# Patient Record
Sex: Female | Born: 1945 | Race: White | Hispanic: No | Marital: Married | Smoking: Never smoker
Health system: Southern US, Community
[De-identification: ages and names within clinical notes are randomized; demographics above are authoritative.]

## PROBLEM LIST (undated history)

## (undated) DIAGNOSIS — I639 Cerebral infarction, unspecified: Secondary | ICD-10-CM

## (undated) DIAGNOSIS — J45909 Unspecified asthma, uncomplicated: Secondary | ICD-10-CM

## (undated) DIAGNOSIS — I4891 Unspecified atrial fibrillation: Secondary | ICD-10-CM

## (undated) DIAGNOSIS — I10 Essential (primary) hypertension: Secondary | ICD-10-CM

## (undated) DIAGNOSIS — E7800 Pure hypercholesterolemia, unspecified: Secondary | ICD-10-CM

## (undated) HISTORY — DX: Pure hypercholesterolemia, unspecified: E78.00

## (undated) HISTORY — DX: Essential (primary) hypertension: I10

## (undated) HISTORY — DX: Unspecified atrial fibrillation: I48.91

## (undated) HISTORY — DX: Cerebral infarction, unspecified: I63.9

---

## 1987-03-18 HISTORY — PX: HYSTERECTOMY ABDOMINAL WITH SALPINGECTOMY: SHX6725

## 1998-09-14 ENCOUNTER — Ambulatory Visit (HOSPITAL_COMMUNITY): Admission: RE | Admit: 1998-09-14 | Discharge: 1998-09-14 | Payer: Self-pay | Admitting: Gastroenterology

## 1998-09-14 ENCOUNTER — Encounter: Payer: Self-pay | Admitting: Gastroenterology

## 2000-09-09 ENCOUNTER — Ambulatory Visit (HOSPITAL_COMMUNITY): Admission: RE | Admit: 2000-09-09 | Discharge: 2000-09-09 | Payer: Self-pay | Admitting: Family Medicine

## 2000-09-09 ENCOUNTER — Encounter: Payer: Self-pay | Admitting: Family Medicine

## 2001-03-17 HISTORY — PX: BREAST REDUCTION SURGERY: SHX8

## 2001-06-23 ENCOUNTER — Other Ambulatory Visit: Admission: RE | Admit: 2001-06-23 | Discharge: 2001-06-23 | Payer: Self-pay | Admitting: Family Medicine

## 2001-09-13 ENCOUNTER — Encounter (INDEPENDENT_AMBULATORY_CARE_PROVIDER_SITE_OTHER): Payer: Self-pay | Admitting: *Deleted

## 2001-09-13 ENCOUNTER — Ambulatory Visit (HOSPITAL_BASED_OUTPATIENT_CLINIC_OR_DEPARTMENT_OTHER): Admission: RE | Admit: 2001-09-13 | Discharge: 2001-09-14 | Payer: Self-pay | Admitting: Specialist

## 2008-11-21 ENCOUNTER — Encounter: Admission: RE | Admit: 2008-11-21 | Discharge: 2008-11-21 | Payer: Self-pay | Admitting: Family Medicine

## 2010-05-07 ENCOUNTER — Other Ambulatory Visit: Payer: Self-pay | Admitting: Family Medicine

## 2010-05-07 DIAGNOSIS — Z1231 Encounter for screening mammogram for malignant neoplasm of breast: Secondary | ICD-10-CM

## 2010-05-13 ENCOUNTER — Ambulatory Visit
Admission: RE | Admit: 2010-05-13 | Discharge: 2010-05-13 | Disposition: A | Discharge status: Home / Self Care | Payer: BC Managed Care – PPO | Source: Ambulatory Visit | Attending: Family Medicine | Admitting: Family Medicine

## 2010-05-13 DIAGNOSIS — Z1231 Encounter for screening mammogram for malignant neoplasm of breast: Secondary | ICD-10-CM

## 2010-08-02 NOTE — Op Note (Signed)
[facility]  Patient:    REIGNA, RUPERTO Visit Number: [id number] MRN: [id number]          Service Type: DSU Location: [facility] Attending Physician:  Gustavus Messing Dictated by:   Yaakov Guthrie. Shon Hough, M.D. Proc. Date: 09/13/01 Admit Date:  09/13/2001                             Operative Report  INDICATIONS:  This is a 65 year old lady with a history of intertrigo as well as increased back and shoulder pain, secondary to large pendulous breasts. She has tried conservative treatment which has failed.  The patient also demonstrates accessory breast tissue right and left sides, right side greater than left side.  PROCEDURE:  Bilateral breast reductions using the inferior pedicle technique, with excision of accessory breast tissue.  SURGEON:  Yaakov Guthrie. Shon Hough, M.D.  ANESTHESIA:  General.  DESCRIPTION OF PROCEDURE:  The patient underwent general anesthesia and was intubated orally.  The prep was done to the chest and breast areas in a routine fashion, after the patient had been drawn for the inferior pedicle reduction mammoplasty, remarking the nipple/areolar complexes back up to 20.0 cm from the suprasternal notch.  After sterile prep and drapes in place, 0.25% Xylocaine with epinephrine was injected locally 100 cc per side, with 1:400,000 concentration.  The wounds were scored with the #15 blade, and then the skin of the inferior pedicle was de-epithelialized with a #20 blade.  The medial and lateral fatty dermal pedicles were incised down to the underlying fascia.  Out laterally more tissue was taken.  Large amounts of accessory breast tissue and axillary latissimus dorsi areas were removed using liposuction catheter, a [place] type 28-3&4.  After this a new key hole area was also debulked, and after proper hemostasis and irrigation the wound flaps were rotated and plicated with #3-0 Prolene.  A subcutaneous closure was done with #3-0  monocryl x 2 layers, then running subcuticular stitches of #3-0 monocryl and #5-0 monocryl throughout the inverted T, with excellent symmetry and good blood supply to the nipple/areolar complexes.  The wounds were drained with #10 Harrison Mons drain which was placed in the depths of the wound and brought out through the lateral-most portion of the incision and secured with #3-0 Prolene.  Steri-Strips and soft dressings were applied to all the areas.  The same procedure was carried out on both sides.  The right breast was larger, over 900 g removed.  The left breast had over 700 g removed.  On the right side just a note:  We removed also a large fibrocystic mass from the area with fluid.  This was sent to the pathologist for separate evaluation.  The patient was taken to the recovery room in excellent condition.  The estimated blood loss was less than 150 cc.  No complications. Dictated by:   Yaakov Guthrie. Shon Hough, M.D. Attending Physician:  Gustavus Messing DD:  09/13/01 TD:  09/14/01 Job: [id number]

## 2011-03-28 DIAGNOSIS — Z23 Encounter for immunization: Secondary | ICD-10-CM | POA: Diagnosis not present

## 2011-03-28 DIAGNOSIS — Z1331 Encounter for screening for depression: Secondary | ICD-10-CM | POA: Diagnosis not present

## 2011-03-28 DIAGNOSIS — J45909 Unspecified asthma, uncomplicated: Secondary | ICD-10-CM | POA: Diagnosis not present

## 2011-03-28 DIAGNOSIS — Z1211 Encounter for screening for malignant neoplasm of colon: Secondary | ICD-10-CM | POA: Diagnosis not present

## 2011-04-28 DIAGNOSIS — Z1211 Encounter for screening for malignant neoplasm of colon: Secondary | ICD-10-CM | POA: Diagnosis not present

## 2012-01-14 DIAGNOSIS — Z23 Encounter for immunization: Secondary | ICD-10-CM | POA: Diagnosis not present

## 2012-03-26 DIAGNOSIS — J45909 Unspecified asthma, uncomplicated: Secondary | ICD-10-CM | POA: Diagnosis not present

## 2012-03-26 DIAGNOSIS — S43429A Sprain of unspecified rotator cuff capsule, initial encounter: Secondary | ICD-10-CM | POA: Diagnosis not present

## 2012-03-26 DIAGNOSIS — M810 Age-related osteoporosis without current pathological fracture: Secondary | ICD-10-CM | POA: Diagnosis not present

## 2012-05-10 DIAGNOSIS — M810 Age-related osteoporosis without current pathological fracture: Secondary | ICD-10-CM | POA: Diagnosis not present

## 2012-11-08 DIAGNOSIS — H31099 Other chorioretinal scars, unspecified eye: Secondary | ICD-10-CM | POA: Diagnosis not present

## 2012-11-08 DIAGNOSIS — H5340 Unspecified visual field defects: Secondary | ICD-10-CM | POA: Diagnosis not present

## 2012-11-08 DIAGNOSIS — H5710 Ocular pain, unspecified eye: Secondary | ICD-10-CM | POA: Diagnosis not present

## 2012-12-28 DIAGNOSIS — Z23 Encounter for immunization: Secondary | ICD-10-CM | POA: Diagnosis not present

## 2013-02-21 ENCOUNTER — Other Ambulatory Visit: Payer: Self-pay | Admitting: Family Medicine

## 2013-02-21 DIAGNOSIS — J45901 Unspecified asthma with (acute) exacerbation: Secondary | ICD-10-CM | POA: Diagnosis not present

## 2013-02-21 DIAGNOSIS — J019 Acute sinusitis, unspecified: Secondary | ICD-10-CM | POA: Diagnosis not present

## 2013-02-21 DIAGNOSIS — Z1239 Encounter for other screening for malignant neoplasm of breast: Secondary | ICD-10-CM | POA: Diagnosis not present

## 2013-02-21 DIAGNOSIS — Z1231 Encounter for screening mammogram for malignant neoplasm of breast: Secondary | ICD-10-CM

## 2013-02-25 ENCOUNTER — Emergency Department (HOSPITAL_BASED_OUTPATIENT_CLINIC_OR_DEPARTMENT_OTHER): Payer: Medicare Other

## 2013-02-25 ENCOUNTER — Encounter (HOSPITAL_BASED_OUTPATIENT_CLINIC_OR_DEPARTMENT_OTHER): Payer: Self-pay | Admitting: Emergency Medicine

## 2013-02-25 ENCOUNTER — Emergency Department (HOSPITAL_BASED_OUTPATIENT_CLINIC_OR_DEPARTMENT_OTHER)
Admission: EM | Admit: 2013-02-25 | Discharge: 2013-02-25 | Disposition: A | Discharge status: Home / Self Care | Payer: Medicare Other | Attending: Emergency Medicine | Admitting: Emergency Medicine

## 2013-02-25 DIAGNOSIS — R0789 Other chest pain: Secondary | ICD-10-CM

## 2013-02-25 DIAGNOSIS — Z79899 Other long term (current) drug therapy: Secondary | ICD-10-CM | POA: Insufficient documentation

## 2013-02-25 DIAGNOSIS — R071 Chest pain on breathing: Secondary | ICD-10-CM | POA: Diagnosis not present

## 2013-02-25 DIAGNOSIS — J45901 Unspecified asthma with (acute) exacerbation: Secondary | ICD-10-CM | POA: Diagnosis not present

## 2013-02-25 DIAGNOSIS — Z792 Long term (current) use of antibiotics: Secondary | ICD-10-CM | POA: Insufficient documentation

## 2013-02-25 DIAGNOSIS — R6889 Other general symptoms and signs: Secondary | ICD-10-CM | POA: Diagnosis not present

## 2013-02-25 DIAGNOSIS — J019 Acute sinusitis, unspecified: Secondary | ICD-10-CM | POA: Diagnosis not present

## 2013-02-25 DIAGNOSIS — J209 Acute bronchitis, unspecified: Secondary | ICD-10-CM | POA: Diagnosis not present

## 2013-02-25 DIAGNOSIS — IMO0002 Reserved for concepts with insufficient information to code with codable children: Secondary | ICD-10-CM | POA: Insufficient documentation

## 2013-02-25 DIAGNOSIS — J40 Bronchitis, not specified as acute or chronic: Secondary | ICD-10-CM | POA: Diagnosis not present

## 2013-02-25 DIAGNOSIS — N644 Mastodynia: Secondary | ICD-10-CM | POA: Diagnosis not present

## 2013-02-25 HISTORY — DX: Unspecified asthma, uncomplicated: J45.909

## 2013-02-25 LAB — BASIC METABOLIC PANEL
BUN: 14 mg/dL (ref 6–23)
Calcium: 9.8 mg/dL (ref 8.4–10.5)
Chloride: 105 mEq/L (ref 96–112)
Creatinine, Ser: 1.1 mg/dL (ref 0.50–1.10)
GFR calc Af Amer: 58 mL/min — ABNORMAL LOW (ref >90)
GFR calc non Af Amer: 50 mL/min — ABNORMAL LOW (ref >90)
Glucose, Bld: 115 mg/dL — ABNORMAL HIGH (ref 70–99)
Potassium: 4.1 mEq/L (ref 3.5–5.1)
Sodium: 140 mEq/L (ref 135–145)

## 2013-02-25 LAB — CBC WITH DIFFERENTIAL/PLATELET
Eosinophils Absolute: 0.2 10*3/uL (ref 0.0–0.7)
Hemoglobin: 13.6 g/dL (ref 12.0–15.0)
Lymphs Abs: 1.2 10*3/uL (ref 0.7–4.0)
MCH: 28 pg (ref 26.0–34.0)
Monocytes Absolute: 0.5 10*3/uL (ref 0.1–1.0)
Monocytes Relative: 7 % (ref 3–12)
Neutro Abs: 6.1 10*3/uL (ref 1.7–7.7)
Neutrophils Relative %: 76 % (ref 43–77)
Platelets: 257 10*3/uL (ref 150–400)
RBC: 4.85 MIL/uL (ref 3.87–5.11)
RDW: 13.8 % (ref 11.5–15.5)
WBC: 8 10*3/uL (ref 4.0–10.5)

## 2013-02-25 MED ORDER — HYDROCOD POLST-CHLORPHEN POLST 10-8 MG/5ML PO LQCR: 5.0000 mL | Freq: Two times a day (BID) | ORAL | Status: DC | PRN

## 2013-02-25 MED ORDER — TRAMADOL HCL 50 MG PO TABS: 50.0000 mg | ORAL_TABLET | Freq: Four times a day (QID) | ORAL | Status: DC | PRN

## 2013-02-25 MED ORDER — ASPIRIN 81 MG PO CHEW
324.0000 mg | CHEWABLE_TABLET | Freq: Once | ORAL | Status: AC
  Administered 2013-02-25: 324 mg via ORAL
  Filled 2013-02-25: qty 4

## 2013-02-25 MED ORDER — KETOROLAC TROMETHAMINE 30 MG/ML IJ SOLN
30.0000 mg | Freq: Once | INTRAMUSCULAR | Status: AC
  Administered 2013-02-25: 30 mg via INTRAVENOUS
  Filled 2013-02-25: qty 1

## 2013-02-25 NOTE — ED Provider Notes (Signed)
CSN: [id number]     Arrival date & time 02/25/13  1027 History   First MD Initiated Contact with Patient 02/25/13 1206     Chief Complaint  Patient presents with  . right breast pain radiating to back    (Consider location/radiation/quality/duration/timing/severity/associated sxs/prior Treatment) HPI Comments: Patient presents with a chief complaint of chest pain.  She reports that the pain is located just inferior to her right breast and radiates around to the right side of her back.  She reports that the pain began around 9 AM this morning after a heavy coughing episode.  She states that the pain has been constant since that time.  Pain is worse with movements, deep breaths, coughing, and palpation.  She has not taken anything for pain prior to arrival.  She denies any SOB, fever, nausea, vomiting, numbness, tingling, diaphoresis, dizziness, or lightheadedness associated with the pain.  She reports that she was recently diagnosed with Acute Sinusitis four days ago and is currently on Amoxicillin for this.  She denies any prior cardiac history.  She has a history of Hyperlipidemia, but no history of HTN or DM.  She does not smoke and never has.  She denies any prolonged travel or surgery in the past 4 weeks.  Denies any lower extremity edema.  Denies use of estrogen containing medications.  Denies prior history of DVT or PE.  The history is provided by the patient.    Past Medical History  Diagnosis Date  . Asthma    History reviewed. No pertinent past surgical history. History reviewed. No pertinent family history. History  Substance Use Topics  . Smoking status: Never Smoker   . Smokeless tobacco: Not on file  . Alcohol Use: No   OB History   Grav Para Term Preterm Abortions TAB SAB Ect Mult Living                 Review of Systems  All other systems reviewed and are negative.    Allergies  Levaquin  Home Medications   Current Outpatient Rx  Name  Route  Sig  Dispense   Refill  . amoxicillin (AMOXIL) 125 MG chewable tablet   Oral   Chew 125 mg by mouth 3 (three) times daily.         . fluticasone-salmeterol (ADVAIR HFA) 115-21 MCG/ACT inhaler   Inhalation   Inhale 2 puffs into the lungs 2 (two) times daily.         Marland Kitchen omeprazole (PRILOSEC) 40 MG capsule   Oral   Take 40 mg by mouth daily.          BP 150/99  Pulse 69  Temp(Src) 98 F (36.7 C) (Oral)  Resp 20  SpO2 99% Physical Exam  Nursing note and vitals reviewed. Constitutional: She appears well-developed and well-nourished.  HENT:  Head: Normocephalic and atraumatic.  Mouth/Throat: Oropharynx is clear and moist.  Neck: Normal range of motion. Neck supple.  Cardiovascular: Normal rate, regular rhythm and normal heart sounds.   Pulmonary/Chest: Effort normal and breath sounds normal. No respiratory distress. She has no wheezes. She has no rales. She exhibits tenderness.  Abdominal: Soft. Bowel sounds are normal. She exhibits no distension and no mass. There is no tenderness. There is no rebound and no guarding.  Musculoskeletal:  No LE edema bilaterally  Neurological: She is alert.  Skin: Skin is warm and dry.  Psychiatric: She has a normal mood and affect.    ED Course  Procedures (including critical  care time) Labs Review Labs Reviewed - No data to display Imaging Review Dg Chest 2 View  02/25/2013   CLINICAL DATA:  Right-sided chest pain.  Productive cough.  EXAM: CHEST  2 VIEW  COMPARISON:  None.  FINDINGS: There is slight peribronchial thickening consistent with bronchitis. There is a linear area of probable scarring at the left lung base. No infiltrates or effusions. Heart size is normal. No acute osseous abnormality. Old healed fracture of the lateral aspect of the right 4th rib.  IMPRESSION: Bronchitic changes.   Electronically Signed   By: Geanie Cooley M.D.   On: 02/25/2013 11:17    EKG Interpretation    Date/Time:  Friday February 25 2013 10:41:25 EST Ventricular  Rate:  57 PR Interval:  134 QRS Duration: 82 QT Interval:  418 QTC Calculation: 406 R Axis:   -9 Text Interpretation:  Sinus bradycardia Possible Anterior infarct , age undetermined Abnormal ECG Confirmed by BEATON  MD, ROBERT ([id number]) on 02/25/2013 11:49:34 AM           2:15 PM Reassessed patient.  Patient reports that her chest pain has improved at this time after getting Toradol.  Patient discussed with Dr. Radford Pax who also evaluated the patient. MDM  No diagnosis found. Patient presenting with chest wall pain.  Pain began this morning after having a heavy coughing episode.  Pain improved after given Toradol.  EKG unremarkable.  Troponin negative.  No acute findings on CXR.  Pulse ox 99 on RA.  Lungs CTAB.  No signs of respiratory distress.  Feel that the patient is stable for discharge.  Patient discharged home with cough suppressant.  Return precautions given.     Santiago Glad, PA-C 02/28/13 [phone]

## 2013-02-25 NOTE — ED Notes (Signed)
Pain around right breast that radiates down side and around to the back onset this morning. She called 911 EMS states they told her the ekg was normal and should come and have further evaluation  Pt states she has been having hard coughing for 3 weeks and this morning when she coughed the pain started.

## 2013-02-28 NOTE — ED Provider Notes (Signed)
Medical screening examination/treatment/procedure(s) were performed by non-physician practitioner and as supervising physician I was immediately available for consultation/collaboration.    Nelia Shi, MD 02/28/13 [phone]

## 2013-03-25 ENCOUNTER — Ambulatory Visit
Admission: RE | Admit: 2013-03-25 | Discharge: 2013-03-25 | Disposition: A | Discharge status: Home / Self Care | Payer: Medicare Other | Source: Ambulatory Visit | Attending: Family Medicine | Admitting: Family Medicine

## 2013-03-25 DIAGNOSIS — Z1231 Encounter for screening mammogram for malignant neoplasm of breast: Secondary | ICD-10-CM

## 2013-05-03 DIAGNOSIS — J45909 Unspecified asthma, uncomplicated: Secondary | ICD-10-CM | POA: Diagnosis not present

## 2013-09-06 DIAGNOSIS — H2510 Age-related nuclear cataract, unspecified eye: Secondary | ICD-10-CM | POA: Diagnosis not present

## 2013-09-06 DIAGNOSIS — H538 Other visual disturbances: Secondary | ICD-10-CM | POA: Diagnosis not present

## 2013-09-06 DIAGNOSIS — H31099 Other chorioretinal scars, unspecified eye: Secondary | ICD-10-CM | POA: Diagnosis not present

## 2013-11-22 DIAGNOSIS — Z23 Encounter for immunization: Secondary | ICD-10-CM | POA: Diagnosis not present

## 2014-05-02 DIAGNOSIS — J45909 Unspecified asthma, uncomplicated: Secondary | ICD-10-CM | POA: Diagnosis not present

## 2014-05-02 DIAGNOSIS — M810 Age-related osteoporosis without current pathological fracture: Secondary | ICD-10-CM | POA: Diagnosis not present

## 2014-05-02 DIAGNOSIS — E780 Pure hypercholesterolemia: Secondary | ICD-10-CM | POA: Diagnosis not present

## 2014-05-02 DIAGNOSIS — Z1389 Encounter for screening for other disorder: Secondary | ICD-10-CM | POA: Diagnosis not present

## 2014-05-02 DIAGNOSIS — R6889 Other general symptoms and signs: Secondary | ICD-10-CM | POA: Diagnosis not present

## 2014-05-02 DIAGNOSIS — Z131 Encounter for screening for diabetes mellitus: Secondary | ICD-10-CM | POA: Diagnosis not present

## 2015-04-10 DIAGNOSIS — Z23 Encounter for immunization: Secondary | ICD-10-CM | POA: Diagnosis not present

## 2015-05-02 DIAGNOSIS — J45909 Unspecified asthma, uncomplicated: Secondary | ICD-10-CM | POA: Diagnosis not present

## 2015-11-27 DIAGNOSIS — H1859 Other hereditary corneal dystrophies: Secondary | ICD-10-CM | POA: Diagnosis not present

## 2015-11-27 DIAGNOSIS — H31093 Other chorioretinal scars, bilateral: Secondary | ICD-10-CM | POA: Diagnosis not present

## 2015-12-26 DIAGNOSIS — Z23 Encounter for immunization: Secondary | ICD-10-CM | POA: Diagnosis not present

## 2016-04-30 DIAGNOSIS — M810 Age-related osteoporosis without current pathological fracture: Secondary | ICD-10-CM | POA: Diagnosis not present

## 2016-04-30 DIAGNOSIS — J45909 Unspecified asthma, uncomplicated: Secondary | ICD-10-CM | POA: Diagnosis not present

## 2016-04-30 DIAGNOSIS — E782 Mixed hyperlipidemia: Secondary | ICD-10-CM | POA: Diagnosis not present

## 2016-04-30 DIAGNOSIS — Z1211 Encounter for screening for malignant neoplasm of colon: Secondary | ICD-10-CM | POA: Diagnosis not present

## 2016-06-24 ENCOUNTER — Other Ambulatory Visit: Payer: Self-pay | Admitting: Gastroenterology

## 2016-06-24 DIAGNOSIS — R1310 Dysphagia, unspecified: Secondary | ICD-10-CM

## 2016-06-24 DIAGNOSIS — Z800 Family history of malignant neoplasm of digestive organs: Secondary | ICD-10-CM | POA: Diagnosis not present

## 2016-06-24 DIAGNOSIS — R1319 Other dysphagia: Secondary | ICD-10-CM

## 2016-06-26 DIAGNOSIS — M810 Age-related osteoporosis without current pathological fracture: Secondary | ICD-10-CM | POA: Diagnosis not present

## 2016-06-26 DIAGNOSIS — M8588 Other specified disorders of bone density and structure, other site: Secondary | ICD-10-CM | POA: Diagnosis not present

## 2016-07-04 ENCOUNTER — Ambulatory Visit
Admission: RE | Admit: 2016-07-04 | Discharge: 2016-07-04 | Disposition: A | Discharge status: Home / Self Care | Payer: Medicare Other | Source: Ambulatory Visit | Attending: Gastroenterology | Admitting: Gastroenterology

## 2016-07-04 DIAGNOSIS — R1319 Other dysphagia: Secondary | ICD-10-CM

## 2016-07-04 DIAGNOSIS — R1310 Dysphagia, unspecified: Secondary | ICD-10-CM

## 2016-07-04 DIAGNOSIS — K228 Other specified diseases of esophagus: Secondary | ICD-10-CM | POA: Diagnosis not present

## 2016-07-18 DIAGNOSIS — Q396 Congenital diverticulum of esophagus: Secondary | ICD-10-CM | POA: Diagnosis not present

## 2016-07-18 DIAGNOSIS — Z1211 Encounter for screening for malignant neoplasm of colon: Secondary | ICD-10-CM | POA: Diagnosis not present

## 2016-08-29 DIAGNOSIS — Z1211 Encounter for screening for malignant neoplasm of colon: Secondary | ICD-10-CM | POA: Diagnosis not present

## 2016-08-29 DIAGNOSIS — D126 Benign neoplasm of colon, unspecified: Secondary | ICD-10-CM | POA: Diagnosis not present

## 2016-08-29 DIAGNOSIS — K219 Gastro-esophageal reflux disease without esophagitis: Secondary | ICD-10-CM | POA: Diagnosis not present

## 2016-08-29 DIAGNOSIS — K228 Other specified diseases of esophagus: Secondary | ICD-10-CM | POA: Diagnosis not present

## 2016-08-29 DIAGNOSIS — R1310 Dysphagia, unspecified: Secondary | ICD-10-CM | POA: Diagnosis not present

## 2016-09-02 DIAGNOSIS — K219 Gastro-esophageal reflux disease without esophagitis: Secondary | ICD-10-CM | POA: Diagnosis not present

## 2016-09-02 DIAGNOSIS — D126 Benign neoplasm of colon, unspecified: Secondary | ICD-10-CM | POA: Diagnosis not present

## 2016-12-05 DIAGNOSIS — H2513 Age-related nuclear cataract, bilateral: Secondary | ICD-10-CM | POA: Diagnosis not present

## 2016-12-17 DIAGNOSIS — Z23 Encounter for immunization: Secondary | ICD-10-CM | POA: Diagnosis not present

## 2017-02-10 DIAGNOSIS — H0230 Blepharochalasis unspecified eye, unspecified eyelid: Secondary | ICD-10-CM | POA: Diagnosis not present

## 2017-02-10 DIAGNOSIS — H2513 Age-related nuclear cataract, bilateral: Secondary | ICD-10-CM | POA: Diagnosis not present

## 2017-02-10 DIAGNOSIS — H25013 Cortical age-related cataract, bilateral: Secondary | ICD-10-CM | POA: Diagnosis not present

## 2017-02-10 DIAGNOSIS — H2511 Age-related nuclear cataract, right eye: Secondary | ICD-10-CM | POA: Diagnosis not present

## 2017-02-10 DIAGNOSIS — H25043 Posterior subcapsular polar age-related cataract, bilateral: Secondary | ICD-10-CM | POA: Diagnosis not present

## 2017-02-10 DIAGNOSIS — H40003 Preglaucoma, unspecified, bilateral: Secondary | ICD-10-CM | POA: Diagnosis not present

## 2017-03-01 DIAGNOSIS — I10 Essential (primary) hypertension: Secondary | ICD-10-CM | POA: Diagnosis not present

## 2017-03-01 DIAGNOSIS — S82402A Unspecified fracture of shaft of left fibula, initial encounter for closed fracture: Secondary | ICD-10-CM | POA: Diagnosis not present

## 2017-03-01 DIAGNOSIS — S82832A Other fracture of upper and lower end of left fibula, initial encounter for closed fracture: Secondary | ICD-10-CM | POA: Diagnosis not present

## 2017-03-01 DIAGNOSIS — M25572 Pain in left ankle and joints of left foot: Secondary | ICD-10-CM | POA: Diagnosis not present

## 2017-03-02 DIAGNOSIS — S82832A Other fracture of upper and lower end of left fibula, initial encounter for closed fracture: Secondary | ICD-10-CM | POA: Diagnosis not present

## 2017-03-19 DIAGNOSIS — S82832D Other fracture of upper and lower end of left fibula, subsequent encounter for closed fracture with routine healing: Secondary | ICD-10-CM | POA: Diagnosis not present

## 2017-03-30 DIAGNOSIS — H2513 Age-related nuclear cataract, bilateral: Secondary | ICD-10-CM | POA: Diagnosis not present

## 2017-03-30 DIAGNOSIS — H2511 Age-related nuclear cataract, right eye: Secondary | ICD-10-CM | POA: Diagnosis not present

## 2017-03-31 DIAGNOSIS — H2512 Age-related nuclear cataract, left eye: Secondary | ICD-10-CM | POA: Diagnosis not present

## 2017-04-20 DIAGNOSIS — H2512 Age-related nuclear cataract, left eye: Secondary | ICD-10-CM | POA: Diagnosis not present

## 2017-04-20 DIAGNOSIS — H2513 Age-related nuclear cataract, bilateral: Secondary | ICD-10-CM | POA: Diagnosis not present

## 2017-04-23 DIAGNOSIS — S82832D Other fracture of upper and lower end of left fibula, subsequent encounter for closed fracture with routine healing: Secondary | ICD-10-CM | POA: Diagnosis not present

## 2017-04-30 DIAGNOSIS — J45909 Unspecified asthma, uncomplicated: Secondary | ICD-10-CM | POA: Diagnosis not present

## 2017-04-30 DIAGNOSIS — Z0000 Encounter for general adult medical examination without abnormal findings: Secondary | ICD-10-CM | POA: Diagnosis not present

## 2017-04-30 DIAGNOSIS — Z1231 Encounter for screening mammogram for malignant neoplasm of breast: Secondary | ICD-10-CM | POA: Diagnosis not present

## 2017-04-30 DIAGNOSIS — R030 Elevated blood-pressure reading, without diagnosis of hypertension: Secondary | ICD-10-CM | POA: Diagnosis not present

## 2017-04-30 DIAGNOSIS — E782 Mixed hyperlipidemia: Secondary | ICD-10-CM | POA: Diagnosis not present

## 2017-04-30 DIAGNOSIS — M810 Age-related osteoporosis without current pathological fracture: Secondary | ICD-10-CM | POA: Diagnosis not present

## 2017-05-07 ENCOUNTER — Other Ambulatory Visit: Payer: Self-pay | Admitting: Physician Assistant

## 2017-05-07 DIAGNOSIS — Z1231 Encounter for screening mammogram for malignant neoplasm of breast: Secondary | ICD-10-CM

## 2017-06-04 DIAGNOSIS — M19072 Primary osteoarthritis, left ankle and foot: Secondary | ICD-10-CM | POA: Diagnosis not present

## 2017-06-04 DIAGNOSIS — S82832D Other fracture of upper and lower end of left fibula, subsequent encounter for closed fracture with routine healing: Secondary | ICD-10-CM | POA: Diagnosis not present

## 2017-06-04 DIAGNOSIS — Z881 Allergy status to other antibiotic agents status: Secondary | ICD-10-CM | POA: Diagnosis not present

## 2017-08-21 DIAGNOSIS — Z23 Encounter for immunization: Secondary | ICD-10-CM | POA: Diagnosis not present

## 2017-10-16 DIAGNOSIS — H43391 Other vitreous opacities, right eye: Secondary | ICD-10-CM | POA: Diagnosis not present

## 2017-11-18 DIAGNOSIS — H43313 Vitreous membranes and strands, bilateral: Secondary | ICD-10-CM | POA: Diagnosis not present

## 2018-01-13 DIAGNOSIS — Z23 Encounter for immunization: Secondary | ICD-10-CM | POA: Diagnosis not present

## 2018-05-03 DIAGNOSIS — Z1231 Encounter for screening mammogram for malignant neoplasm of breast: Secondary | ICD-10-CM | POA: Diagnosis not present

## 2018-05-03 DIAGNOSIS — J45909 Unspecified asthma, uncomplicated: Secondary | ICD-10-CM | POA: Diagnosis not present

## 2018-05-03 DIAGNOSIS — M810 Age-related osteoporosis without current pathological fracture: Secondary | ICD-10-CM | POA: Diagnosis not present

## 2018-05-03 DIAGNOSIS — E782 Mixed hyperlipidemia: Secondary | ICD-10-CM | POA: Diagnosis not present

## 2018-05-03 DIAGNOSIS — Z0000 Encounter for general adult medical examination without abnormal findings: Secondary | ICD-10-CM | POA: Diagnosis not present

## 2018-05-03 DIAGNOSIS — Z13820 Encounter for screening for osteoporosis: Secondary | ICD-10-CM | POA: Diagnosis not present

## 2018-05-07 ENCOUNTER — Other Ambulatory Visit: Payer: Self-pay | Admitting: Physician Assistant

## 2018-05-07 DIAGNOSIS — M810 Age-related osteoporosis without current pathological fracture: Secondary | ICD-10-CM

## 2018-05-14 ENCOUNTER — Other Ambulatory Visit: Payer: Self-pay | Admitting: Anesthesiology

## 2018-05-14 DIAGNOSIS — Z1231 Encounter for screening mammogram for malignant neoplasm of breast: Secondary | ICD-10-CM

## 2019-01-04 DIAGNOSIS — Z23 Encounter for immunization: Secondary | ICD-10-CM | POA: Diagnosis not present

## 2019-06-13 DIAGNOSIS — H43391 Other vitreous opacities, right eye: Secondary | ICD-10-CM | POA: Diagnosis not present

## 2019-12-27 DIAGNOSIS — Z23 Encounter for immunization: Secondary | ICD-10-CM | POA: Diagnosis not present

## 2020-02-15 DIAGNOSIS — Z23 Encounter for immunization: Secondary | ICD-10-CM | POA: Diagnosis not present

## 2020-02-22 DIAGNOSIS — H43391 Other vitreous opacities, right eye: Secondary | ICD-10-CM | POA: Diagnosis not present

## 2020-05-09 DIAGNOSIS — R1310 Dysphagia, unspecified: Secondary | ICD-10-CM | POA: Diagnosis not present

## 2020-05-09 DIAGNOSIS — J45909 Unspecified asthma, uncomplicated: Secondary | ICD-10-CM | POA: Diagnosis not present

## 2020-05-09 DIAGNOSIS — E782 Mixed hyperlipidemia: Secondary | ICD-10-CM | POA: Diagnosis not present

## 2020-05-21 DIAGNOSIS — G319 Degenerative disease of nervous system, unspecified: Secondary | ICD-10-CM | POA: Diagnosis not present

## 2020-05-21 DIAGNOSIS — I6502 Occlusion and stenosis of left vertebral artery: Secondary | ICD-10-CM | POA: Diagnosis not present

## 2020-05-21 DIAGNOSIS — I499 Cardiac arrhythmia, unspecified: Secondary | ICD-10-CM | POA: Diagnosis not present

## 2020-05-21 DIAGNOSIS — I6782 Cerebral ischemia: Secondary | ICD-10-CM | POA: Diagnosis not present

## 2020-05-21 DIAGNOSIS — R531 Weakness: Secondary | ICD-10-CM | POA: Diagnosis not present

## 2020-05-21 DIAGNOSIS — Z79899 Other long term (current) drug therapy: Secondary | ICD-10-CM | POA: Diagnosis not present

## 2020-05-21 DIAGNOSIS — I4891 Unspecified atrial fibrillation: Secondary | ICD-10-CM | POA: Diagnosis not present

## 2020-05-21 DIAGNOSIS — I6340 Cerebral infarction due to embolism of unspecified cerebral artery: Secondary | ICD-10-CM | POA: Diagnosis not present

## 2020-05-21 DIAGNOSIS — I10 Essential (primary) hypertension: Secondary | ICD-10-CM | POA: Diagnosis not present

## 2020-05-21 DIAGNOSIS — I517 Cardiomegaly: Secondary | ICD-10-CM | POA: Diagnosis not present

## 2020-05-21 DIAGNOSIS — R0602 Shortness of breath: Secondary | ICD-10-CM | POA: Diagnosis not present

## 2020-05-21 DIAGNOSIS — J4530 Mild persistent asthma, uncomplicated: Secondary | ICD-10-CM | POA: Diagnosis not present

## 2020-05-21 DIAGNOSIS — G459 Transient cerebral ischemic attack, unspecified: Secondary | ICD-10-CM | POA: Diagnosis not present

## 2020-05-21 DIAGNOSIS — Z888 Allergy status to other drugs, medicaments and biological substances status: Secondary | ICD-10-CM | POA: Diagnosis not present

## 2020-05-21 DIAGNOSIS — R42 Dizziness and giddiness: Secondary | ICD-10-CM | POA: Diagnosis not present

## 2020-05-21 DIAGNOSIS — Z7901 Long term (current) use of anticoagulants: Secondary | ICD-10-CM | POA: Diagnosis not present

## 2020-05-21 DIAGNOSIS — R519 Headache, unspecified: Secondary | ICD-10-CM | POA: Diagnosis not present

## 2020-05-21 DIAGNOSIS — E785 Hyperlipidemia, unspecified: Secondary | ICD-10-CM | POA: Diagnosis not present

## 2020-05-22 DIAGNOSIS — I517 Cardiomegaly: Secondary | ICD-10-CM | POA: Diagnosis not present

## 2020-05-22 DIAGNOSIS — I10 Essential (primary) hypertension: Secondary | ICD-10-CM | POA: Diagnosis not present

## 2020-05-22 DIAGNOSIS — R519 Headache, unspecified: Secondary | ICD-10-CM | POA: Diagnosis not present

## 2020-05-22 DIAGNOSIS — E785 Hyperlipidemia, unspecified: Secondary | ICD-10-CM | POA: Diagnosis not present

## 2020-05-22 DIAGNOSIS — I4891 Unspecified atrial fibrillation: Secondary | ICD-10-CM | POA: Diagnosis not present

## 2020-05-22 DIAGNOSIS — I080 Rheumatic disorders of both mitral and aortic valves: Secondary | ICD-10-CM | POA: Diagnosis not present

## 2020-05-22 DIAGNOSIS — I63432 Cerebral infarction due to embolism of left posterior cerebral artery: Secondary | ICD-10-CM | POA: Diagnosis not present

## 2020-05-22 DIAGNOSIS — I6782 Cerebral ischemia: Secondary | ICD-10-CM | POA: Diagnosis not present

## 2020-05-23 DIAGNOSIS — E785 Hyperlipidemia, unspecified: Secondary | ICD-10-CM | POA: Diagnosis not present

## 2020-05-23 DIAGNOSIS — I4891 Unspecified atrial fibrillation: Secondary | ICD-10-CM | POA: Diagnosis not present

## 2020-05-23 DIAGNOSIS — I63432 Cerebral infarction due to embolism of left posterior cerebral artery: Secondary | ICD-10-CM | POA: Diagnosis not present

## 2020-05-23 DIAGNOSIS — I10 Essential (primary) hypertension: Secondary | ICD-10-CM | POA: Diagnosis not present

## 2020-06-01 DIAGNOSIS — I639 Cerebral infarction, unspecified: Secondary | ICD-10-CM | POA: Diagnosis not present

## 2020-06-01 DIAGNOSIS — Z8679 Personal history of other diseases of the circulatory system: Secondary | ICD-10-CM | POA: Diagnosis not present

## 2020-06-01 DIAGNOSIS — I10 Essential (primary) hypertension: Secondary | ICD-10-CM | POA: Diagnosis not present

## 2020-06-28 DIAGNOSIS — I10 Essential (primary) hypertension: Secondary | ICD-10-CM | POA: Diagnosis not present

## 2020-06-28 DIAGNOSIS — Z8679 Personal history of other diseases of the circulatory system: Secondary | ICD-10-CM | POA: Diagnosis not present

## 2020-07-18 ENCOUNTER — Ambulatory Visit: Payer: PPO | Admitting: Neurology

## 2020-07-18 ENCOUNTER — Encounter: Payer: Self-pay | Admitting: Neurology

## 2020-07-18 VITALS — BP 168/80 | HR 75 | Ht 61.0 in | Wt 186.0 lb

## 2020-07-18 DIAGNOSIS — I63412 Cerebral infarction due to embolism of left middle cerebral artery: Secondary | ICD-10-CM

## 2020-07-18 DIAGNOSIS — I480 Paroxysmal atrial fibrillation: Secondary | ICD-10-CM | POA: Diagnosis not present

## 2020-07-18 NOTE — Progress Notes (Signed)
[facility] [address] [place]. [place] [address] [phone]       OFFICE CONSULT NOTE  [name] Date of Birth:  1945/06/10 Medical Record Number:  [id number]   Referring MD: Carolee Rota, NP Reason for Referral: Stroke  HPI: Sophia Frederick is a pleasant 34 Caucasian lady seen today for 75 initial office consultation visit for stroke.  History is obtained from the patient and review of electronic medical records in [place].  I was not able to view actual images in PACS but reports were available.  She has past medical history of hypertension, hyperlipidemia and asthma.  She presented to her primary care physician's office on 05/21/2020 for sudden onset of headache, some memory difficulties and right-sided peripheral vision loss.  She was found to be in new onset atrial fibrillation with rapid ventricular rate and was referred to the emergency room.  She reported that for a week prior she had been having episodic spells where she was not feeling right and had difficulty remembering where things were in the kitchen and word finding difficulties and difficulty walking.  She also had a mild to moderate headache which is not disabling.  In the PCPs office heart rate was 140 and she was in atrial fibrillation.  She was treated in the ER with Cardizem infusion CT scan of the head was unremarkable.  CT angiogram of the brain and neck did not reveal any large vessel intracranial extracranial stenosis or occlusion.  Subsequently MRI was obtained which shows scattered left medial parietal embolic infarcts and remote age left frontal white matter infarct and changes of small vessel disease.  Echocardiogram showed normal ejection fraction without thrombus.  She is started on Eliquis for anticoagulation and Crestor for elevated lipids and medication for hypertension.  LDL cholesterol was 150 mg percent and hemoglobin A1c was 5.5.  Patient states she is done well since discharge.   She is tolerating Eliquis well with only minor bruising but no bleeding.  She has an upcoming appointment to see Dr. Alvester Chou cardiologist for her A. fib.  Memory is improved.  She is no longer has any vision difficulties.  She has no new complaints.  Her blood pressure is better controlled at home though today it is elevated in office at 168/80.  She is tolerating Crestor well without muscle aches and pains.  She has not had any follow-up lipid profile checked  ROS:   14 system review of systems is positive for memory difficulties, headache, vision loss, bruising and all other systems negative  PMH:  Past Medical History:  Diagnosis Date  . A-fib ([place])   . Asthma   . Hypercholesteremia   . Hypertension   . Stroke [facility])     Social History:  Social History   Socioeconomic History  . Marital status: Married    Spouse name: Sophia Frederick  . Number of children: Not on file  . Years of education: Not on file  . Highest education level: Not on file  Occupational History  . Not on file  Tobacco Use  . Smoking status: Never Smoker  . Smokeless tobacco: Never Used  Substance and Sexual Activity  . Alcohol use: No  . Drug use: No  . Sexual activity: Not on file  Other Topics Concern  . Not on file  Social History Narrative   Lives at home with Sophia Frederick husband   Right Handed   Drinks caffeine seldomly   Social Determinants of Radio broadcast assistant  Strain: Not on file  Food Insecurity: Not on file  Transportation Needs: Not on file  Physical Activity: Not on file  Stress: Not on file  Social Connections: Not on file  Intimate Partner Violence: Not on file    Medications:   Current Outpatient Medications on File Prior to Visit  Medication Sig Dispense Refill  . albuterol (VENTOLIN HFA) 108 (90 Base) MCG/ACT inhaler Inhale into the lungs every 6 (six) hours as needed for wheezing or shortness of breath.    Marland Kitchen apixaban (ELIQUIS) 5 MG TABS tablet Take 5 mg by mouth 2 (two) times daily.     . cholecalciferol (VITAMIN D3) 25 MCG (1000 UNIT) tablet Take 1,000 Units by mouth daily.    Marland Kitchen diltiazem (DILACOR XR) 120 MG 24 hr capsule Take 120 mg by mouth daily.    Marland Kitchen esomeprazole (NEXIUM) 20 MG capsule Take 20 mg by mouth daily at 12 noon.    . fluticasone-salmeterol (ADVAIR HFA) 115-21 MCG/ACT inhaler Inhale 2 puffs into the lungs 2 (two) times daily.    Marland Kitchen lisinopril (ZESTRIL) 10 MG tablet Take 10 mg by mouth daily.    . Multiple Vitamins-Minerals (MULTIVITAMIN WITH MINERALS) tablet Take 1 tablet by mouth daily.    Marland Kitchen omega-3 acid ethyl esters (LOVAZA) 1 g capsule Take by mouth 2 (two) times daily.    . rosuvastatin (CRESTOR) 10 MG tablet Take 10 mg by mouth daily.    Marland Kitchen amoxicillin (AMOXIL) 125 MG chewable tablet Chew 125 mg by mouth 3 (three) times daily.    . chlorpheniramine-HYDROcodone (TUSSIONEX PENNKINETIC ER) 10-8 MG/5ML LQCR Take 5 mLs by mouth every 12 (twelve) hours as needed for cough. 115 mL 0  . omeprazole (PRILOSEC) 40 MG capsule Take 40 mg by mouth daily.     No current facility-administered medications on file prior to visit.    Allergies:   Allergies  Allergen Reactions  . Levaquin [Levofloxacin In D5w]     Physical Exam General: well developed, well nourished elderly Caucasian lady, seated, in no evident distress Head: head normocephalic and atraumatic.   Neck: supple with no carotid or supraclavicular bruits Cardiovascular: regular rate and rhythm, no murmurs Musculoskeletal: no deformity Skin:  no rash/petichiae Vascular:  Normal pulses all extremities scattered petechiae both upper extremities.  Neurologic Exam Mental Status: Awake and fully alert. Oriented to place and time. Recent and remote memory intact. Attention span, concentration and fund of knowledge appropriate. Mood and affect appropriate.  Cranial Nerves: Fundoscopic exam reveals sharp disc margins. Pupils equal, briskly reactive to light. Extraocular movements full without nystagmus. Visual  fields full to confrontation. Hearing intact. Facial sensation intact. Face, tongue, palate moves normally and symmetrically.  Motor: Normal bulk and tone. Normal strength in all tested extremity muscles. Sensory.: intact to touch , pinprick , position and vibratory sensation.  Coordination: Rapid alternating movements normal in all extremities. Finger-to-nose and heel-to-shin performed accurately bilaterally. Gait and Station: Arises from chair without difficulty. Stance is normal. Gait demonstrates normal stride length and balance . Able to heel, toe and tandem walk with moderate difficulty.  Reflexes: 1+ and symmetric. Toes downgoing.   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  Vascular risk factors of atrial fibrillation, hyperlipidemia , mild obesity and hypertension.     PLAN: I had a long d/w patient about her recent embolic stroke, atrial fibrillation diagnosis,risk for recurrent stroke/TIAs,  personally independently reviewed imaging studies and stroke evaluation results and answered questions.Continue Eliquis 5 mg twice daily for secondary stroke prevention and maintain strict control of hypertension with blood pressure goal below 130/90, diabetes with hemoglobin A1c goal below 6.5% and lipids with LDL cholesterol goal below 70 mg/dL. I also advised the patient to eat a healthy diet with plenty of whole grains, cereals, fruits and vegetables, exercise regularly and maintain ideal body weight.  Check follow-up lipid profile today.  She was encouraged to keep her scheduled appointment with cardiologist Dr. Gwenlyn Found.  Followup in the future with my nurse practitioner Janett Billow in 3 months or call earlier if necessary.  Greater than 50% time during this 45-minute visit was spent on counseling and coordination of care about embolic stroke and new diagnosis of  atrial fibrillation and answering questions Antony Contras, MD Note: This document was prepared with digital dictation and possible smart phrase technology. Any transcriptional errors that result from this process are unintentional.

## 2020-07-18 NOTE — Patient Instructions (Signed)
I had a long d/w patient about her recent embolic stroke, atrial fibrillation diagnosis,risk for recurrent stroke/TIAs, personally independently reviewed imaging studies and stroke evaluation results and answered questions.Continue Eliquis 5 mg twice daily for secondary stroke prevention and maintain strict control of hypertension with blood pressure goal below 130/90, diabetes with hemoglobin A1c goal below 6.5% and lipids with LDL cholesterol goal below 70 mg/dL. I also advised the patient to eat a healthy diet with plenty of whole grains, cereals, fruits and vegetables, exercise regularly and maintain ideal body weight.  Check follow-up lipid profile today.  Followup in the future with my nurse practitioner Janett Billow in 3 months or call earlier if necessary.  Stroke Prevention Some medical conditions and behaviors are associated with a higher chance of having a stroke. You can help prevent a stroke by making nutrition, lifestyle, and other changes, including managing any medical conditions you may have. What nutrition changes can be made?  Eat healthy foods. You can do this by: ? Choosing foods high in fiber, such as fresh fruits and vegetables and whole grains. ? Eating at least 5 or more servings of fruits and vegetables a day. Try to fill half of your plate at each meal with fruits and vegetables. ? Choosing lean protein foods, such as lean cuts of meat, poultry without skin, fish, tofu, beans, and nuts. ? Eating low-fat dairy products. ? Avoiding foods that are high in salt (sodium). This can help lower blood pressure. ? Avoiding foods that have saturated fat, trans fat, and cholesterol. This can help prevent high cholesterol. ? Avoiding processed and premade foods.  Follow your health care provider's specific guidelines for losing weight, controlling high blood pressure (hypertension), lowering high cholesterol, and managing diabetes. These may include: ? Reducing your daily calorie  intake. ? Limiting your daily sodium intake to 1,500 milligrams (mg). ? Using only healthy fats for cooking, such as olive oil, canola oil, or sunflower oil. ? Counting your daily carbohydrate intake.   What lifestyle changes can be made?  Maintain a healthy weight. Talk to your health care provider about your ideal weight.  Get at least 30 minutes of moderate physical activity at least 5 days a week. Moderate activity includes brisk walking, biking, and swimming.  Do not use any products that contain nicotine or tobacco, such as cigarettes and e-cigarettes. If you need help quitting, ask your health care provider. It may also be helpful to avoid exposure to secondhand smoke.  Limit alcohol intake to no more than 1 drink a day for nonpregnant women and 2 drinks a day for men. One drink equals 12 oz of beer, 5 oz of wine, or 1 oz of hard liquor.  Stop any illegal drug use.  Avoid taking birth control pills. Talk to your health care provider about the risks of taking birth control pills if: ? You are over 63 years old. ? You smoke. ? You get migraines. ? You have ever had a blood clot. What other changes can be made?  Manage your cholesterol levels. ? Eating a healthy diet is important for preventing high cholesterol. If cholesterol cannot be managed through diet alone, you may also need to take medicines. ? Take any prescribed medicines to control your cholesterol as told by your health care provider.  Manage your diabetes. ? Eating a healthy diet and exercising regularly are important parts of managing your blood sugar. If your blood sugar cannot be managed through diet and exercise, you may need to take  medicines. ? Take any prescribed medicines to control your diabetes as told by your health care provider.  Control your hypertension. ? To reduce your risk of stroke, try to keep your blood pressure below 130/80. ? Eating a healthy diet and exercising regularly are an important part  of controlling your blood pressure. If your blood pressure cannot be managed through diet and exercise, you may need to take medicines. ? Take any prescribed medicines to control hypertension as told by your health care provider. ? Ask your health care provider if you should monitor your blood pressure at home. ? Have your blood pressure checked every year, even if your blood pressure is normal. Blood pressure increases with age and some medical conditions.  Get evaluated for sleep disorders (sleep apnea). Talk to your health care provider about getting a sleep evaluation if you snore a lot or have excessive sleepiness.  Take over-the-counter and prescription medicines only as told by your health care provider. Aspirin or blood thinners (antiplatelets or anticoagulants) may be recommended to reduce your risk of forming blood clots that can lead to stroke.  Make sure that any other medical conditions you have, such as atrial fibrillation or atherosclerosis, are managed. What are the warning signs of a stroke? The warning signs of a stroke can be easily remembered as BEFAST.  B is for balance. Signs include: ? Dizziness. ? Loss of balance or coordination. ? Sudden trouble walking.  E is for eyes. Signs include: ? A sudden change in vision. ? Trouble seeing.  F is for face. Signs include: ? Sudden weakness or numbness of the face. ? The face or eyelid drooping to one side.  A is for arms. Signs include: ? Sudden weakness or numbness of the arm, usually on one side of the body.  S is for speech. Signs include: ? Trouble speaking (aphasia). ? Trouble understanding.  T is for time. ? These symptoms may represent a serious problem that is an emergency. Do not wait to see if the symptoms will go away. Get medical help right away. Call your local emergency services (911 in the [place] Do not drive yourself to the hospital.  Other signs of stroke may include: ? A sudden, severe headache  with no known cause. ? Nausea or vomiting. ? Seizure. Where to find more information For more information, visit:  American Stroke Association: www.strokeassociation.org  National Stroke Association: www.stroke.org Summary  You can prevent a stroke by eating healthy, exercising, not smoking, limiting alcohol intake, and managing any medical conditions you may have.  Do not use any products that contain nicotine or tobacco, such as cigarettes and e-cigarettes. If you need help quitting, ask your health care provider. It may also be helpful to avoid exposure to secondhand smoke.  Remember BEFAST for warning signs of stroke. Get help right away if you or a loved one has any of these signs. This information is not intended to replace advice given to you by your health care provider. Make sure you discuss any questions you have with your health care provider. Document Revised: 02/13/2017 Document Reviewed: 04/08/2016 Elsevier Patient Education  2021 [employer].

## 2020-07-19 ENCOUNTER — Telehealth: Payer: Self-pay | Admitting: Emergency Medicine

## 2020-07-19 LAB — LIPID PANEL
Chol/HDL Ratio: 4.3 ratio (ref 0.0–4.4)
Cholesterol, Total: 173 mg/dL (ref 100–199)
HDL: 40 mg/dL (ref >39)
LDL Chol Calc (NIH): 99 mg/dL (ref 0–99)
Triglycerides: 199 mg/dL — ABNORMAL HIGH (ref 0–149)
VLDL Cholesterol Cal: 34 mg/dL (ref 5–40)

## 2020-07-19 NOTE — Progress Notes (Signed)
Kindly inform patient that cholesterol is mostly ok except trigl;ycerides a bit high and to see primary md for tretment options.

## 2020-07-19 NOTE — Telephone Encounter (Signed)
-----   Message from Garvin Fila, MD sent at 07/19/2020 12:48 PM EDT ----- Sophia Frederick inform patient that cholesterol is mostly ok except trigl;ycerides a bit high and to see primary md for tretment options.

## 2020-07-19 NOTE — Telephone Encounter (Signed)
Called and spoke to patient regarding lab results.  Discussed Dr. Clydene Fake recommendations.  Patient verbalized understanding.  Patient denied further questions, verbalized understanding and expressed appreciation for the phone call.

## 2020-07-20 DIAGNOSIS — I639 Cerebral infarction, unspecified: Secondary | ICD-10-CM | POA: Diagnosis not present

## 2020-07-20 DIAGNOSIS — E782 Mixed hyperlipidemia: Secondary | ICD-10-CM | POA: Diagnosis not present

## 2020-07-20 DIAGNOSIS — Z8679 Personal history of other diseases of the circulatory system: Secondary | ICD-10-CM | POA: Diagnosis not present

## 2020-07-20 DIAGNOSIS — I10 Essential (primary) hypertension: Secondary | ICD-10-CM | POA: Diagnosis not present

## 2020-08-14 ENCOUNTER — Ambulatory Visit: Payer: PPO | Admitting: Cardiovascular Disease

## 2020-08-14 ENCOUNTER — Encounter: Payer: Self-pay | Admitting: Cardiovascular Disease

## 2020-08-14 ENCOUNTER — Other Ambulatory Visit: Payer: Self-pay

## 2020-08-14 DIAGNOSIS — E782 Mixed hyperlipidemia: Secondary | ICD-10-CM | POA: Diagnosis not present

## 2020-08-14 DIAGNOSIS — I480 Paroxysmal atrial fibrillation: Secondary | ICD-10-CM | POA: Diagnosis not present

## 2020-08-14 DIAGNOSIS — E785 Hyperlipidemia, unspecified: Secondary | ICD-10-CM | POA: Insufficient documentation

## 2020-08-14 DIAGNOSIS — I10 Essential (primary) hypertension: Secondary | ICD-10-CM

## 2020-08-14 DIAGNOSIS — Z8673 Personal history of transient ischemic attack (TIA), and cerebral infarction without residual deficits: Secondary | ICD-10-CM | POA: Diagnosis not present

## 2020-08-14 NOTE — Assessment & Plan Note (Signed)
History of hyperlipidemia on Crestor and Lovaza with lipid profile performed 07/18/2020 revealing total cholesterol 173, LDL of 99 and HDL 40.

## 2020-08-14 NOTE — Assessment & Plan Note (Signed)
Patient was hospitalized at [facility] in [place] in March of this year for TIA/stroke.  She was found to be in A. fib with RVR.  Work-up was unrevealing.  She was placed on Eliquis.  She remains in sinus rhythm.

## 2020-08-14 NOTE — Progress Notes (Signed)
08/14/2020 Sophia Frederick   21-Oct-1945  [id number]  Primary Physician Orpah Melter, MD Primary Cardiologist: Lorretta Harp MD Lupe Carney, [place]  HPI:  Sophia Frederick is a 75 y.o. mild to moderately overweight married Caucasian female mother of 2, grandmother 1 grandchild referred by Carolee Rota, NP from [place] at [facility] for evaluation and treatment of recent PAF and stroke.  She does have a history of treated hypertension and hyperlipidemia.  There is no family history for heart disease.  She is never had a heart attack but did have a stroke back in March.  She was hospitalized at [facility] in [place] where she was found to be in A. fib with RVR, converted to sinus rhythm.  Work-up was unrevealing when 2D echo and CTA of her neck.  She was begun on Eliquis oral anticoagulation.  She does have shortness of breath probably related to a reactive airways disease but denies chest pain. The CHA2DSVASC2 score is 6  .   Current Meds  Medication Sig  . albuterol (VENTOLIN HFA) 108 (90 Base) MCG/ACT inhaler Inhale into the lungs every 6 (six) hours as needed for wheezing or shortness of breath.  Marland Kitchen apixaban (ELIQUIS) 5 MG TABS tablet Take 5 mg by mouth 2 (two) times daily.  . cholecalciferol (VITAMIN D3) 25 MCG (1000 UNIT) tablet Take 1,000 Units by mouth daily.  Marland Kitchen diltiazem (CARDIZEM CD) 120 MG 24 hr capsule Take 120 mg by mouth daily.  Marland Kitchen diltiazem (DILACOR XR) 120 MG 24 hr capsule Take 120 mg by mouth daily.  Marland Kitchen esomeprazole (NEXIUM) 20 MG capsule Take 20 mg by mouth daily at 12 noon.  . fluticasone-salmeterol (ADVAIR HFA) 115-21 MCG/ACT inhaler Inhale 2 puffs into the lungs 2 (two) times daily.  Marland Kitchen lisinopril (ZESTRIL) 10 MG tablet Take 10 mg by mouth daily.  . Multiple Vitamins-Minerals (MULTIVITAMIN WITH MINERALS) tablet Take 1 tablet by mouth daily.  Marland Kitchen omega-3 acid ethyl esters (LOVAZA) 1 g capsule Take by mouth 2 (two) times daily.  . rosuvastatin (CRESTOR) 10 MG tablet Take  10 mg by mouth daily.  Marland Kitchen VITAMIN D, CHOLECALCIFEROL, PO Take by mouth.     Allergies  Allergen Reactions  . Levaquin [Levofloxacin In D5w]     Social History   Socioeconomic History  . Marital status: Married    Spouse name: Shanon Brow  . Number of children: Not on file  . Years of education: Not on file  . Highest education level: Not on file  Occupational History  . Not on file  Tobacco Use  . Smoking status: Never Smoker  . Smokeless tobacco: Never Used  Substance and Sexual Activity  . Alcohol use: No  . Drug use: No  . Sexual activity: Not on file  Other Topics Concern  . Not on file  Social History Narrative   Lives at home with Shanon Brow husband   Right Handed   Drinks caffeine seldomly   Social Determinants of Health   Financial Resource Strain: Not on file  Food Insecurity: Not on file  Transportation Needs: Not on file  Physical Activity: Not on file  Stress: Not on file  Social Connections: Not on file  Intimate Partner Violence: Not on file     Review of Systems: General: negative for chills, fever, night sweats or weight changes.  Cardiovascular: negative for chest pain, dyspnea on exertion, edema, orthopnea, palpitations, paroxysmal nocturnal dyspnea or shortness of breath Dermatological: negative for rash Respiratory: negative for  cough or wheezing Urologic: negative for hematuria Abdominal: negative for nausea, vomiting, diarrhea, bright red blood per rectum, melena, or hematemesis Neurologic: negative for visual changes, syncope, or dizziness All other systems reviewed and are otherwise negative except as noted above.    Blood pressure 134/79, pulse 70, height 5\' 1"  (1.549 m), weight 186 lb 3.2 oz (84.5 kg).  General appearance: alert and no distress Neck: no adenopathy, no JVD, supple, symmetrical, trachea midline, thyroid not enlarged, symmetric, no tenderness/mass/nodules and Soft left carotid bruit Lungs: clear to auscultation bilaterally Heart:  regular rate and rhythm, S1, S2 normal, no murmur, click, rub or gallop Extremities: extremities normal, atraumatic, no cyanosis or edema Pulses: 2+ and symmetric Skin: Skin color, texture, turgor normal. No rashes or lesions Neurologic: Alert and oriented X 3, normal strength and tone. Normal symmetric reflexes. Normal coordination and gait  EKG sinus rhythm at 70 with occasional PACs.  I personally reviewed this EKG.  ASSESSMENT AND PLAN:   History of stroke Patient was hospitalized at [facility] in [place] in March of this year for TIA/stroke.  She was found to be in A. fib with RVR.  Work-up was unrevealing.  She was placed on Eliquis.  She remains in sinus rhythm.  Essential hypertension History of essential hypertension blood pressure measured today 134/79.  She is on diltiazem and lisinopril.  Hyperlipidemia History of hyperlipidemia on Crestor and Lovaza with lipid profile performed 07/18/2020 revealing total cholesterol 173, LDL of 99 and HDL 40.  PAF (paroxysmal atrial fibrillation) ([place]) History of PAF with recent stroke demonstrated at [facility] in [place] where she was hospitalized for several days.  She was initially found to be in A. fib with RVR and converted to sinus rhythm.  She was placed on Eliquis oral anticoagulation.  A CTA of the neck showed no evidence of carotid disease and 2D echo was essentially normal with mild to moderate left atrial dilatation.      Lorretta Harp MD FACP,FACC,FAHA, [facility] 08/14/2020 11:17 AM

## 2020-08-14 NOTE — Patient Instructions (Signed)

## 2020-08-14 NOTE — Assessment & Plan Note (Addendum)
History of PAF with recent stroke demonstrated at [facility] in [place] where she was hospitalized for several days.  She was initially found to be in A. fib with RVR and converted to sinus rhythm.  She was placed on Eliquis oral anticoagulation.  A CTA of the neck showed no evidence of carotid disease and 2D echo was essentially normal with mild to moderate left atrial dilatation. This patients CHA2DS2-VASc Score and unadjusted Ischemic Stroke Rate (% per year) is equal to 9.7 % stroke rate/year from a score of 6  Above score calculated as 1 point each if present [CHF, HTN, DM, Vascular=MI/PAD/Aortic Plaque, Age if 65-74, or Female] Above score calculated as 2 points each if present [Age > 75, or Stroke/TIA/TE]

## 2020-08-14 NOTE — Assessment & Plan Note (Signed)
History of essential hypertension blood pressure measured today 134/79.  She is on diltiazem and lisinopril.

## 2020-08-15 NOTE — Addendum Note (Signed)
Addended by: Angelena Form on: 08/15/2020 04:12 PM   Modules accepted: Orders

## 2020-10-25 ENCOUNTER — Telehealth: Payer: Self-pay | Admitting: Cardiovascular Disease

## 2020-10-25 DIAGNOSIS — I10 Essential (primary) hypertension: Secondary | ICD-10-CM | POA: Diagnosis not present

## 2020-10-25 DIAGNOSIS — J45909 Unspecified asthma, uncomplicated: Secondary | ICD-10-CM | POA: Diagnosis not present

## 2020-10-25 DIAGNOSIS — R0602 Shortness of breath: Secondary | ICD-10-CM | POA: Diagnosis not present

## 2020-10-25 DIAGNOSIS — E782 Mixed hyperlipidemia: Secondary | ICD-10-CM | POA: Diagnosis not present

## 2020-10-25 DIAGNOSIS — I639 Cerebral infarction, unspecified: Secondary | ICD-10-CM | POA: Diagnosis not present

## 2020-10-25 DIAGNOSIS — Z8679 Personal history of other diseases of the circulatory system: Secondary | ICD-10-CM | POA: Diagnosis not present

## 2020-10-25 NOTE — Telephone Encounter (Signed)
Patient requested a call back tomorrow morning.   Pt c/o Shortness Of Breath: STAT if SOB developed within the last 24 hours or pt is noticeably SOB on the phone  1. Are you currently SOB (can you hear that pt is SOB on the phone)?  No   2. How long have you been experiencing SOB?  Patient states she is unable to pinpoint exactly how long it has been going on because she has asthma, however, she states it is becoming progressively worse  3. Are you SOB when sitting or when up moving around?  When up and moving around  4. Are you currently experiencing any other symptoms?  Back pain

## 2020-10-26 NOTE — Telephone Encounter (Signed)
Spoke to patient. She states for the last month she has been having more shortness of breath with with walking  and  discomfort in her shoulder blade and back pain . Patient states she does have asthma issues and  getting  re assess by pulm. For  her condition.  Patient states  that her shortness of breath became very noticeable to her daughter and son -in Sports coach. That is why she is calling today .   RN  offered an appointment to patient  10/30/20 at 9:15 am. Patient verbalized she  would accept appointment.

## 2020-10-30 ENCOUNTER — Encounter: Payer: Self-pay | Admitting: Physician Assistant

## 2020-10-30 ENCOUNTER — Other Ambulatory Visit: Payer: Self-pay

## 2020-10-30 ENCOUNTER — Ambulatory Visit (INDEPENDENT_AMBULATORY_CARE_PROVIDER_SITE_OTHER): Payer: PPO | Admitting: Physician Assistant

## 2020-10-30 VITALS — BP 132/86 | HR 61 | Ht 61.0 in | Wt 189.4 lb

## 2020-10-30 DIAGNOSIS — E785 Hyperlipidemia, unspecified: Secondary | ICD-10-CM

## 2020-10-30 DIAGNOSIS — R079 Chest pain, unspecified: Secondary | ICD-10-CM | POA: Diagnosis not present

## 2020-10-30 DIAGNOSIS — Z8673 Personal history of transient ischemic attack (TIA), and cerebral infarction without residual deficits: Secondary | ICD-10-CM | POA: Diagnosis not present

## 2020-10-30 DIAGNOSIS — I10 Essential (primary) hypertension: Secondary | ICD-10-CM | POA: Diagnosis not present

## 2020-10-30 DIAGNOSIS — I480 Paroxysmal atrial fibrillation: Secondary | ICD-10-CM

## 2020-10-30 NOTE — Progress Notes (Signed)
Cardiology Office Note:    Date:  11/01/2020   ID:  Sophia Frederick, DOB [birth year], MRN [id number]  PCP:  Orpah Melter, MD   [facility] Providers Cardiologist:  Quay Burow, MD {   Referring MD: Orpah Melter, MD   Chief Complaint  Patient presents with   Follow-up    Back pain with exertion, seen for Dr. Gwenlyn Found    History of Present Illness:    Sophia Frederick is a 75 y.o. female with a hx of hypertension, hyperlipidemia, PAF and stroke.  She has never had a heart attack in the past. She was admitted at [facility] in March 2022 with stroke and found to have atrial fibrillation with RVR.  CT of the head was normal, however MRI showed small scattered acute/recent infarct in the left medial periatrial lobe as well as remote left frontal lobe white matter infarct and mild chronic small vessel ischemic changes.  She was converted to sinus rhythm.  Work-up included CTA of her neck showed no significant carotid disease.  Echocardiogram showed normal EF 55 to 60% with mild to moderate LAE, mild AI, no thrombus.  During the hospitalization, even despite rate control, patient was going in and out of atrial fibrillation and therefore was started on Eliquis.  Although patient had a prior history of intolerance of statin medication, she was switched to Crestor upon discharge.  LDL was 150 in the hospital.  Patient presents today for follow-up.  Most recent lipid panel obtained last week at the PCPs office shows LDL of 79, triglyceride over 200.  I discussed with the patient possibility of adding a Zetia, however she is hesitant to do so at this time.  She does complain of dyspnea on exertion for the past several weeks.  She has also noticed a spot in between his shoulder blade on her upper back that has been having pain every time she walks around or lift heavy object.  The location does not feel painful with palpation or body rotation or even bending over.  Symptom is concerning for  angina even though location of the pain is not in the chest but in the upper back.  Symptom is reproducible with exertion and relieved by rest.  I recommended proceed with nuclear stress test.  Other lab work obtained last week shows normal hemoglobin and stable renal function.  She is scheduled to see her new pulmonologist next month.   Past Medical History:  Diagnosis Date   A-fib ([place])    Asthma    Hypercholesteremia    Hypertension    Stroke [facility])     Past Surgical History:  Procedure Laterality Date   BREAST REDUCTION SURGERY  2003   HYSTERECTOMY ABDOMINAL WITH SALPINGECTOMY  1989    Current Medications: Current Meds  Medication Sig   albuterol (VENTOLIN HFA) 108 (90 Base) MCG/ACT inhaler Inhale into the lungs every 6 (six) hours as needed for wheezing or shortness of breath.   apixaban (ELIQUIS) 5 MG TABS tablet Take 5 mg by mouth 2 (two) times daily.   cholecalciferol (VITAMIN D3) 25 MCG (1000 UNIT) tablet Take 1,000 Units by mouth daily.   diltiazem (DILACOR XR) 120 MG 24 hr capsule Take 120 mg by mouth daily.   esomeprazole (NEXIUM) 20 MG capsule Take 20 mg by mouth daily at 12 noon.   fluticasone-salmeterol (ADVAIR HFA) 115-21 MCG/ACT inhaler Inhale 2 puffs into the lungs 2 (two) times daily.   lisinopril (ZESTRIL) 10 MG tablet Take 10 mg  by mouth daily.   Multiple Vitamins-Minerals (MULTIVITAMIN WITH MINERALS) tablet Take 1 tablet by mouth daily.   omega-3 acid ethyl esters (LOVAZA) 1 g capsule Take by mouth 2 (two) times daily.   rosuvastatin (CRESTOR) 20 MG tablet Take 20 mg by mouth at bedtime.     Allergies:   Levaquin [levofloxacin in d5w]   Social History   Socioeconomic History   Marital status: Married    Spouse name: Shanon Brow   Number of children: Not on file   Years of education: Not on file   Highest education level: Not on file  Occupational History   Not on file  Tobacco Use   Smoking status: Never   Smokeless tobacco: Never  Substance and Sexual  Activity   Alcohol use: No   Drug use: No   Sexual activity: Not on file  Other Topics Concern   Not on file  Social History Narrative   Lives at home with Shanon Brow husband   Right Handed   Drinks caffeine seldomly   Social Determinants of Health   Financial Resource Strain: Not on file  Food Insecurity: Not on file  Transportation Needs: Not on file  Physical Activity: Not on file  Stress: Not on file  Social Connections: Not on file     Family History: The patient's family history is not on file.  ROS:   Please see the history of present illness.     All other systems reviewed and are negative.  EKGs/Labs/Other Studies Reviewed:    The following studies were reviewed today:  N/A  EKG:  EKG is ordered today.  The ekg ordered today demonstrates normal sinus rhythm, no significant ST-T wave changes  Recent Labs: No results found for requested labs within last 8760 hours.  Recent Lipid Panel    Component Value Date/Time   CHOL 173 07/18/2020 1107   TRIG 199 (H) 07/18/2020 1107   HDL 40 07/18/2020 1107   CHOLHDL 4.3 07/18/2020 1107   LDLCALC 99 07/18/2020 1107     Risk Assessment/Calculations:    CHA2DS2-VASc Score = 6  This indicates a 9.7% annual risk of stroke. The patient's score is based upon: CHF History: No HTN History: Yes Diabetes History: No Stroke History: Yes Vascular Disease History: No Age Score: 2 Gender Score: 1         Physical Exam:    VS:  BP 132/86 (BP Location: Left Arm, Patient Position: Sitting, Cuff Size: Large)   Pulse 61   Ht '5\' 1"'$  (1.549 m)   Wt 189 lb 6.4 oz (85.9 kg)   SpO2 98%   BMI 35.79 kg/m     Wt Readings from Last 3 Encounters:  10/30/20 189 lb 6.4 oz (85.9 kg)  08/14/20 186 lb 3.2 oz (84.5 kg)  07/18/20 186 lb (84.4 kg)     GEN:  Well nourished, well developed in no acute distress HEENT: Normal NECK: No JVD; No carotid bruits LYMPHATICS: No lymphadenopathy CARDIAC: RRR, no murmurs, rubs,  gallops RESPIRATORY:  Clear to auscultation without rales, wheezing or rhonchi  ABDOMEN: Soft, non-tender, non-distended MUSCULOSKELETAL:  No edema; No deformity  SKIN: Warm and dry NEUROLOGIC:  Alert and oriented x 3 PSYCHIATRIC:  Normal affect   ASSESSMENT:    1. Chest pain, unspecified type   2. Essential hypertension   3. Hyperlipidemia LDL goal <70   4. PAF (paroxysmal atrial fibrillation) ([place])   5. H/O: CVA (cerebrovascular accident)    PLAN:    In order of  problems listed above:  Back pain: Her pain is not in the chest but more in the upper back between the shoulder blades.  Symptoms occur with physical exertion and are relieved by rest.  It is not exacerbated by deep inspiration, body rotation, palpation or bending over.  I am concerned that her symptom is anginal equivalent.  She will need a nuclear stress test to further assess.  Her son-in-law is a family medicine physician who is also concerned of her symptom.  I agree with his assessment that her symptoms sounds less like spinal issue but more like angina.  Hypertension: Blood pressure stable  Hyperlipidemia: her LDL goal should be less than 70 in light of her stroke.  Triglyceride is also borderline elevated as well.  I recommended addition of Zetia, however patient's hesitant to do so at this time.  PAF: Diagnosed earlier this year in the setting of stroke.  Continue Eliquis and diltiazem  History of CVA: Occurred in the setting of A. fib earlier this year.  No significant neurological deficit at this time   Shared Decision Making/Informed Consent The risks [chest pain, shortness of breath, cardiac arrhythmias, dizziness, blood pressure fluctuations, myocardial infarction, stroke/transient ischemic attack, nausea, vomiting, allergic reaction, radiation exposure, metallic taste sensation and life-threatening complications (estimated to be 1 in 10,000)], benefits (risk stratification, diagnosing coronary artery disease,  treatment guidance) and alternatives of a nuclear stress test were discussed in detail with Ms. Dias and she agrees to proceed.    Medication Adjustments/Labs and Tests Ordered: Current medicines are reviewed at length with the patient today.  Concerns regarding medicines are outlined above.  Orders Placed This Encounter  Procedures   MYOCARDIAL PERFUSION IMAGING   EKG 12-Lead   No orders of the defined types were placed in this encounter.   Patient Instructions  Medication Instructions:  Your physician recommends that you continue on your current medications as directed. Please refer to the Current Medication list given to you today.  *If you need a refill on your cardiac medications before your next appointment, please call your pharmacy*  Lab Work: NONE ordered at this time of appointment   If you have labs (blood work) drawn today and your tests are completely normal, you will receive your results only by: [place] (if you have MyChart) OR A paper copy in the mail If you have any lab test that is abnormal or we need to change your treatment, we will call you to review the results.  Testing/Procedures: Your physician has requested that you have a lexiscan myoview. For further information please visit [email or web address]. Please follow instruction sheet, as given.  Please schedule for 1-2 weeks   Follow-Up: At [facility], you and your health needs are our priority.  As part of our continuing mission to provide you with exceptional heart care, we have created designated Provider Care Teams.  These Care Teams include your primary Cardiologist (physician) and Advanced Practice Providers (APPs -  Physician Assistants and Nurse Practitioners) who all work together to provide you with the care you need, when you need it.  We recommend signing up for the patient portal called "MyChart".  Sign up information is provided on this After Visit Summary.  MyChart is used to  connect with patients for Virtual Visits (Telemedicine).  Patients are able to view lab/test results, encounter notes, upcoming appointments, etc.  Non-urgent messages can be sent to your provider as well.   To learn more about what you can do with MyChart,  go to [email or web address].    Your next appointment:   4-6 week(s)  The format for your next appointment:   In Person  Provider:   Almyra Deforest, PA-C  Other Instructions    Signed, Almyra Deforest, [place]  11/01/2020 12:18 AM    [place]

## 2020-10-30 NOTE — Patient Instructions (Signed)
Medication Instructions:  Your physician recommends that you continue on your current medications as directed. Please refer to the Current Medication list given to you today.  *If you need a refill on your cardiac medications before your next appointment, please call your pharmacy*  Lab Work: NONE ordered at this time of appointment   If you have labs (blood work) drawn today and your tests are completely normal, you will receive your results only by: [place] (if you have MyChart) OR A paper copy in the mail If you have any lab test that is abnormal or we need to change your treatment, we will call you to review the results.  Testing/Procedures: Your physician has requested that you have a lexiscan myoview. For further information please visit [email or web address]. Please follow instruction sheet, as given.  Please schedule for 1-2 weeks   Follow-Up: At [facility], you and your health needs are our priority.  As part of our continuing mission to provide you with exceptional heart care, we have created designated Provider Care Teams.  These Care Teams include your primary Cardiologist (physician) and Advanced Practice Providers (APPs -  Physician Assistants and Nurse Practitioners) who all work together to provide you with the care you need, when you need it.  We recommend signing up for the patient portal called "MyChart".  Sign up information is provided on this After Visit Summary.  MyChart is used to connect with patients for Virtual Visits (Telemedicine).  Patients are able to view lab/test results, encounter notes, upcoming appointments, etc.  Non-urgent messages can be sent to your provider as well.   To learn more about what you can do with MyChart, go to [email or web address].    Your next appointment:   4-6 week(s)  The format for your next appointment:   In Person  Provider:   Almyra Deforest, PA-C  Other Instructions

## 2020-11-01 ENCOUNTER — Encounter: Payer: Self-pay | Admitting: Physician Assistant

## 2020-11-05 ENCOUNTER — Encounter: Payer: Self-pay | Admitting: Adult Health

## 2020-11-05 ENCOUNTER — Ambulatory Visit: Payer: PPO | Admitting: Adult Health

## 2020-11-05 ENCOUNTER — Other Ambulatory Visit: Payer: Self-pay

## 2020-11-05 VITALS — BP 148/82 | HR 62 | Ht 61.0 in | Wt 192.4 lb

## 2020-11-05 DIAGNOSIS — I480 Paroxysmal atrial fibrillation: Secondary | ICD-10-CM

## 2020-11-05 DIAGNOSIS — R519 Headache, unspecified: Secondary | ICD-10-CM

## 2020-11-05 DIAGNOSIS — I63412 Cerebral infarction due to embolism of left middle cerebral artery: Secondary | ICD-10-CM | POA: Diagnosis not present

## 2020-11-05 NOTE — Progress Notes (Signed)
[facility] [address] [place]. [place] [address] [phone]       STROKE FOLLOW UP NOTE  [name] Date of Birth:  [birth year] Medical Record Number:  [id number]   Referring MD: Carolee Rota, NP Reason for Referral: Stroke  Chief Complaint  Patient presents with   Atrial fibrillation    Rm 2, 3 month FU, here alone, "more trouble breathing-have apt with pulmonologist;  stress test on Thurs; getting pain in between shoulders checked out; some headaches"       HPI:   Today, 11/05/2020, Sophia Frederick for 29-monthstroke follow-up unaccompanied.  Overall stable.  Denies new or reoccurring stroke/TIA symptoms.  Compliant on Eliquis and Crestor without side effects.  Blood pressure today 148/82. Routinely monitors at home - typically 120-130s/80s.  Chronic headaches (left eye and frontal) present for "many years" stable. Frontal headaches mild - she believes allergy/sinus related. Left eye pain headache short lasting duration - denies visual changes, photophobia, phonophobia or N/V. Reports present since sinus surgery over 12 years ago.   Of note, being worked up by cardiology for possible angina with plans on undergoing stress test this Thursday. Reports extensive family cardiac history.  Also c/o shortness of breath - has initial eval with pulm 9/13.  Lab work completed by PCP recently which was satisfactory (unable to view via epic).   No further concerns at this time    History provided for reference purposes only Consult visit 07/18/2020 Dr. SLeonie Man Ms. Sophia Greenspanis a pleasant 89Caucasian Frederick seen today for initial office consultation visit for stroke.  History is obtained from the patient and review of electronic medical records [place]  I was not able to view actual images in PACS but reports were available.  She has past medical history of hypertension, hyperlipidemia and asthma.  She presented to her primary care physician's office on  05/21/2020 for sudden onset of headache, some memory difficulties and right-sided peripheral vision loss.  She was found to be in new onset atrial fibrillation with rapid ventricular rate and was referred to the emergency room.  She reported that for a week prior she had been having episodic spells where she was not feeling right and had difficulty remembering where things were in the kitchen and word finding difficulties and difficulty walking.  She also had a mild to moderate headache which is not disabling.  In the PCPs office heart rate was 140 and she was in atrial fibrillation.  She was treated in the ER with Cardizem infusion CT scan of the head was unremarkable.  CT angiogram of the brain and neck did not reveal any large vessel intracranial extracranial stenosis or occlusion.  Subsequently MRI was obtained which shows scattered left medial parietal embolic infarcts and remote age left frontal white matter infarct and changes of small vessel disease.  Echocardiogram showed normal ejection fraction without thrombus.  She is started on Eliquis for anticoagulation and Crestor for elevated lipids and medication for hypertension.  LDL cholesterol was 150 mg percent and hemoglobin A1c was 5.5.  Patient states she is done well since discharge.  She is tolerating Eliquis well with only minor bruising but no bleeding.  She has an upcoming appointment to see Dr. BAlvester Choucardiologist for her A. fib.  Memory is improved.  She is no longer has any vision difficulties.  She has no new complaints.  Her blood pressure is better controlled at home though today it is elevated in office  at 168/80.  She is tolerating Crestor well without muscle aches and pains.  She has not had any follow-up lipid profile checked  ROS:   14 system review of systems is positive for those listed in HPI and all other systems negative  PMH:  Past Medical History:  Diagnosis Date   A-fib ([place])    Asthma    Hypercholesteremia    Hypertension     Stroke [facility])     Social History:  Social History   Socioeconomic History   Marital status: Married    Spouse name: Sophia Frederick   Number of children: Not on file   Years of education: Not on file   Highest education level: Not on file  Occupational History   Not on file  Tobacco Use   Smoking status: Never   Smokeless tobacco: Never  Substance and Sexual Activity   Alcohol use: No   Drug use: No   Sexual activity: Not on file  Other Topics Concern   Not on file  Social History Narrative   Lives at home with Sophia Frederick husband   Right Handed   Drinks caffeine seldomly   Social Determinants of Health   Financial Resource Strain: Not on file  Food Insecurity: Not on file  Transportation Needs: Not on file  Physical Activity: Not on file  Stress: Not on file  Social Connections: Not on file  Intimate Partner Violence: Not on file    Medications:   Current Outpatient Medications on File Prior to Visit  Medication Sig Dispense Refill   albuterol (VENTOLIN HFA) 108 (90 Base) MCG/ACT inhaler Inhale into the lungs every 6 (six) hours as needed for wheezing or shortness of breath.     apixaban (ELIQUIS) 5 MG TABS tablet Take 5 mg by mouth 2 (two) times daily.     cholecalciferol (VITAMIN D3) 25 MCG (1000 UNIT) tablet Take 1,000 Units by mouth daily.     diltiazem (DILACOR XR) 120 MG 24 hr capsule Take 120 mg by mouth daily.     esomeprazole (NEXIUM) 20 MG capsule Take 20 mg by mouth daily at 12 noon.     fluticasone-salmeterol (ADVAIR HFA) 115-21 MCG/ACT inhaler Inhale 2 puffs into the lungs 2 (two) times daily.     lisinopril (ZESTRIL) 10 MG tablet Take 10 mg by mouth daily.     Multiple Vitamins-Minerals (MULTIVITAMIN WITH MINERALS) tablet Take 1 tablet by mouth daily.     omega-3 acid ethyl esters (LOVAZA) 1 g capsule Take by mouth 2 (two) times daily.     rosuvastatin (CRESTOR) 20 MG tablet Take 20 mg by mouth at bedtime.     No current facility-administered medications on file  prior to visit.    Allergies:   Allergies  Allergen Reactions   Levaquin [Levofloxacin In D5w]     Physical Exam Today's Vitals   11/05/20 0901  BP: (!) 148/82  Pulse: 62  Weight: 192 lb 6.4 oz (87.3 kg)  Height: '5\' 1"'$  (1.549 m)   Body mass index is 36.35 kg/m.   General: well developed, well nourished very pleasant elderly Caucasian Frederick, seated, in no evident distress Head: head normocephalic and atraumatic.   Neck: supple with no carotid or supraclavicular bruits Cardiovascular: regular rate and rhythm, no murmurs Musculoskeletal: no deformity Skin:  no rash/petichiae Vascular:  Normal pulses all extremities  Neurologic Exam Mental Status: Awake and fully alert.  Fluent speech and language.  Oriented to place and time. Recent and remote memory intact. Attention span,  concentration and fund of knowledge appropriate. Mood and affect appropriate.  Cranial Nerves: Pupils equal, briskly reactive to light. Extraocular movements full without nystagmus. Visual fields full to confrontation. Hearing intact. Facial sensation intact. Face, tongue, palate moves normally and symmetrically.  Motor: Normal bulk and tone. Normal strength in all tested extremity muscles. Sensory.: intact to touch , pinprick , position and vibratory sensation.  Coordination: Rapid alternating movements normal in all extremities. Finger-to-nose and heel-to-shin performed accurately bilaterally. Gait and Station: Arises from chair without difficulty. Stance is normal. Gait demonstrates normal stride length and balance . Able to heel, toe and tandem walk with mild difficulty.  Reflexes: 1+ and symmetric. Toes downgoing.      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  Vascular risk factors of atrial fibrillation, hyperlipidemia , mild obesity, chronic headaches, and  hypertension.     Continue Eliquis 5 mg twice daily and Crestor 20 mg daily for secondary stroke prevention and maintain strict control of hypertension with blood pressure goal below 130/90 and lipids with LDL cholesterol goal below 70 mg/dL.   Continue to monitor chronic headaches with headache diary - no indication for prophylaxis therapy at this time Routine follow-up with Dr. Gwenlyn Found cardiology for atrial fibrillation and Eliquis management  Continue to follow with cardiology and eval with pulmonology for angina symptoms and shortness of breath with exertion    Follow-up in 6 months or earlier as needed   CC:  [place] provider: Orpah Melter, MD   I spent 36 minutes of face-to-face and non-face-to-face time with patient.  This included previsit chart review, lab review, study review, electronic health record documentation, patient education and discussion regarding hx of stroke, secondary stroke prevention measures and aggressive stroke risk factor management, chronic headaches and further monitoring, cardiac and pulm concerns, and answered all other questions to patient's satisfaction  Frann Rider, AGNP-BC  [facility] [address] [place], [place] [address]  Phone [phone] Fax [phone] Note: This document was prepared with digital dictation and possible smart phrase technology. Any transcriptional errors that result from this process are unintentional.

## 2020-11-05 NOTE — Patient Instructions (Addendum)
Continue to monitor headaches and frequency - try to keep a "headache diary" so we are able to track them better such as how often they occur, what you were doing at that time, what you recently ate, etc.   Continue Eliquis (apixaban) daily  and Crestor for secondary stroke prevention  Continue to follow up with PCP regarding cholesterol and blood pressure management  Maintain strict control of hypertension with blood pressure goal below 130/90 and cholesterol with LDL cholesterol (bad cholesterol) goal below 70 mg/dL.       Followup in the future with me in 6 months or call earlier if needed      Thank you for coming to see [place] at [facility]. I hope we have been able to provide you high quality care today.  You may receive a patient satisfaction survey over the next few weeks. We would appreciate your feedback and comments so that we may continue to improve ourselves and the health of our patients.

## 2020-11-06 NOTE — Progress Notes (Signed)
I agree with the above plan 

## 2020-11-07 ENCOUNTER — Telehealth (HOSPITAL_COMMUNITY): Payer: Self-pay

## 2020-11-07 NOTE — Telephone Encounter (Signed)
Detailed instructions left on the patient's answering machine. Asked to call back with any questions. S. EMTP 

## 2020-11-08 ENCOUNTER — Ambulatory Visit (HOSPITAL_COMMUNITY): Payer: PPO | Attending: Cardiovascular Disease

## 2020-11-08 ENCOUNTER — Other Ambulatory Visit: Payer: Self-pay

## 2020-11-08 DIAGNOSIS — R079 Chest pain, unspecified: Secondary | ICD-10-CM | POA: Diagnosis not present

## 2020-11-08 LAB — MYOCARDIAL PERFUSION IMAGING
Base ST Depression (mm): 0 mm
LV dias vol: 80 mL (ref 46–106)
LV sys vol: 35 mL
Nuc Stress EF: 56 %
Peak HR: 88 {beats}/min
Rest HR: 74 {beats}/min
Rest Nuclear Isotope Dose: 11 mCi
SDS: 1
SRS: 0
SSS: 1
ST Depression (mm): 0 mm
Stress Nuclear Isotope Dose: 30.9 mCi
TID: 1

## 2020-11-08 MED ORDER — TECHNETIUM TC 99M TETROFOSMIN IV KIT
30.9000 | PACK | Freq: Once | INTRAVENOUS | Status: AC | PRN
Start: 2020-11-08 — End: 2020-11-08
  Administered 2020-11-08: 30.9 via INTRAVENOUS
  Filled 2020-11-08: qty 31

## 2020-11-08 MED ORDER — REGADENOSON 0.4 MG/5ML IV SOLN
0.4000 mg | Freq: Once | INTRAVENOUS | Status: AC
  Administered 2020-11-08: 0.4 mg via INTRAVENOUS

## 2020-11-08 MED ORDER — TECHNETIUM TC 99M TETROFOSMIN IV KIT
11.0000 | PACK | Freq: Once | INTRAVENOUS | Status: AC | PRN
Start: 2020-11-08 — End: 2020-11-08
  Administered 2020-11-08: 11 via INTRAVENOUS
  Filled 2020-11-08: qty 11

## 2020-11-12 ENCOUNTER — Telehealth: Payer: Self-pay | Admitting: Cardiovascular Disease

## 2020-11-12 NOTE — Telephone Encounter (Signed)
[place], [place]  11/08/2020  4:17 PM EDT     Low risk stress test, normal pumping function of heart, no sign of significant reversible blockage seen. Reassuring study.     The patient has been notified of the result and verbalized understanding.  All questions (if any) were answered. Nuala Alpha, LPN [id number] PM

## 2020-11-12 NOTE — Telephone Encounter (Signed)
Patient calling to get stress test results. Please call back

## 2020-11-27 ENCOUNTER — Encounter: Payer: Self-pay | Admitting: Pulmonary Disease

## 2020-11-27 ENCOUNTER — Other Ambulatory Visit: Payer: Self-pay

## 2020-11-27 ENCOUNTER — Ambulatory Visit: Payer: PPO | Admitting: Pulmonary Disease

## 2020-11-27 ENCOUNTER — Ambulatory Visit (INDEPENDENT_AMBULATORY_CARE_PROVIDER_SITE_OTHER): Payer: PPO | Admitting: Physician Assistant

## 2020-11-27 ENCOUNTER — Encounter: Payer: Self-pay | Admitting: Physician Assistant

## 2020-11-27 VITALS — BP 142/80 | HR 64 | Temp 97.6°F | Ht 60.63 in | Wt 191.8 lb

## 2020-11-27 VITALS — BP 128/70 | HR 64 | Resp 20 | Ht 61.0 in | Wt 190.5 lb

## 2020-11-27 DIAGNOSIS — Z23 Encounter for immunization: Secondary | ICD-10-CM | POA: Diagnosis not present

## 2020-11-27 DIAGNOSIS — R0602 Shortness of breath: Secondary | ICD-10-CM | POA: Diagnosis not present

## 2020-11-27 DIAGNOSIS — J4530 Mild persistent asthma, uncomplicated: Secondary | ICD-10-CM | POA: Diagnosis not present

## 2020-11-27 DIAGNOSIS — M546 Pain in thoracic spine: Secondary | ICD-10-CM

## 2020-11-27 LAB — CBC WITH DIFFERENTIAL/PLATELET
Basophils Absolute: 0.1 10*3/uL (ref 0.0–0.1)
Basophils Relative: 0.6 % (ref 0.0–3.0)
Eosinophils Absolute: 0.4 10*3/uL (ref 0.0–0.7)
Eosinophils Relative: 3.3 % (ref 0.0–5.0)
HCT: 41.5 % (ref 36.0–46.0)
Hemoglobin: 13.9 g/dL (ref 12.0–15.0)
Lymphocytes Relative: 18.3 % (ref 12.0–46.0)
Lymphs Abs: 2.1 10*3/uL (ref 0.7–4.0)
MCHC: 33.4 g/dL (ref 30.0–36.0)
MCV: 86.5 fl (ref 78.0–100.0)
Monocytes Absolute: 0.8 10*3/uL (ref 0.1–1.0)
Monocytes Relative: 6.7 % (ref 3.0–12.0)
Neutro Abs: 8 10*3/uL — ABNORMAL HIGH (ref 1.4–7.7)
Neutrophils Relative %: 71.1 % (ref 43.0–77.0)
Platelets: 256 10*3/uL (ref 150.0–400.0)
RBC: 4.8 Mil/uL (ref 3.87–5.11)
RDW: 13.8 % (ref 11.5–15.5)
WBC: 11.3 10*3/uL — ABNORMAL HIGH (ref 4.0–10.5)

## 2020-11-27 LAB — TROPONIN T: Troponin T (Highly Sensitive): 11 ng/L (ref 0–14)

## 2020-11-27 MED ORDER — FLUTICASONE-SALMETEROL 500-50 MCG/ACT IN AEPB: 1.0000 | INHALATION_SPRAY | Freq: Two times a day (BID) | RESPIRATORY_TRACT | 5 refills | Status: DC

## 2020-11-27 NOTE — Addendum Note (Signed)
Addended by: Suzzanne Cloud E on: 11/27/2020 10:46 AM   Modules accepted: Orders

## 2020-11-27 NOTE — Patient Instructions (Addendum)
Shortness of breath --Agree with Cardiology evaluation --Management of asthma as noted below  Mild persistent asthma, not in exacerbation --INCREASE Advair to 500-50 mcg ONE puff TWICE a day --ARRANGE for pulmonary function test --Discussed pulmonary rehab. Patient declined for now. Please contact our office if you wish for referral to [place] vs [place] location to be placed  Follow-up with me on October 3rd

## 2020-11-27 NOTE — Progress Notes (Signed)
Subjective:   PATIENT ID: Sophia Frederick GENDER: female DOB: February 15, 1946, MRN: [id number]   HPI  Chief Complaint  Patient presents with   Consult    Asthma referred by: Carolee Rota Patient doesn't feel like it's asthma, states sob with back pain, states lots of huffing and puffing with little energy    Reason for Visit: New consult for shortness of breath  [name] is a 75 year old female never smoker with prior history of asthma, atrial fibrillation, HTN, HLD, hx left MCA embolic stroke in 123456 who presents as a new consult.  She was diagnosed with asthma in her 10s. After moving from [place] to [place] in 1996. She has not had an exacerbation in over 5-10 years. She is on Advair and reports in general being well-controlled. However since her stroke in March, she has felt decreased energy and activity since then. She reported asthma flare 1-2 weeks after hospital discharge. She increased her doses of advair but did not need prednisone taper. Now she reports shortness of breath worsening in the last 3-4 weeks associated with wheezing. Occasional cough due to throat irritation. Worsens when laying down. Improves with rest. She is on advair diskus one puff twice daily. Does not her rescue inhaler. Associated with back pain between her shoulders. She wants to go back walking with her family. Her hips and energy limit her activity.  Note reviewed by her PCP 10/25/20 by Carolee Rota, FNP. Documented she uses albuterol twice a day. Has woken up with shortness of breath. Wheezing worsening.  Social History: Never smoker  Son in Sports coach - family medicine doctor  Asthma Control Test ACT Total Score  11/27/2020 13   I have personally reviewed patient's past medical/family/social history, allergies, current medications.  Past Medical History:  Diagnosis Date   A-fib ([place])    Asthma    Hypercholesteremia    Hypertension    Stroke ([place])      Family History  Problem  Relation Age of Onset   Asthma Other      Social History   Occupational History   Not on file  Tobacco Use   Smoking status: Never   Smokeless tobacco: Never  Substance and Sexual Activity   Alcohol use: No   Drug use: No   Sexual activity: Not on file    Allergies  Allergen Reactions   Levaquin [Levofloxacin In D5w]      Outpatient Medications Prior to Visit  Medication Sig Dispense Refill   apixaban (ELIQUIS) 5 MG TABS tablet Take 5 mg by mouth 2 (two) times daily.     cholecalciferol (VITAMIN D3) 25 MCG (1000 UNIT) tablet Take 1,000 Units by mouth daily.     co-enzyme Q-10 30 MG capsule Take 30 mg by mouth 3 (three) times daily.     diltiazem (DILACOR XR) 120 MG 24 hr capsule Take 120 mg by mouth daily.     esomeprazole (NEXIUM) 20 MG capsule Take 20 mg by mouth daily at 12 noon.     fluticasone-salmeterol (ADVAIR HFA) 115-21 MCG/ACT inhaler Inhale 2 puffs into the lungs 2 (two) times daily.     lisinopril (ZESTRIL) 10 MG tablet Take 10 mg by mouth daily.     Multiple Vitamins-Minerals (MULTIVITAMIN WITH MINERALS) tablet Take 1 tablet by mouth daily.     omega-3 acid ethyl esters (LOVAZA) 1 g capsule Take by mouth 2 (two) times daily.     rosuvastatin (CRESTOR) 20 MG tablet Take  20 mg by mouth at bedtime.     albuterol (VENTOLIN HFA) 108 (90 Base) MCG/ACT inhaler Inhale into the lungs every 6 (six) hours as needed for wheezing or shortness of breath. (Patient not taking: Reported on 11/27/2020)     No facility-administered medications prior to visit.    Review of Systems  Constitutional:  Negative for chills, diaphoresis, fever, malaise/fatigue and weight loss.  HENT:  Negative for congestion, ear pain and sore throat.   Respiratory:  Positive for cough, shortness of breath and wheezing. Negative for hemoptysis and sputum production.   Cardiovascular:  Negative for chest pain, palpitations and leg swelling.  Gastrointestinal:  Negative for abdominal pain, heartburn and  nausea.  Genitourinary:  Negative for frequency.  Musculoskeletal:  Positive for joint pain. Negative for myalgias.  Skin:  Negative for itching and rash.  Neurological:  Positive for headaches. Negative for dizziness and weakness.  Endo/Heme/Allergies:  Does not bruise/bleed easily.  Psychiatric/Behavioral:  Negative for depression. The patient is not nervous/anxious.     Objective:   Vitals:   11/27/20 0942  BP: (!) 142/80  Pulse: 64  Temp: 97.6 F (36.4 C)  TempSrc: Oral  SpO2: 96%  Weight: 191 lb 12.8 oz (87 kg)  Height: 5' 0.63" (1.54 m)   SpO2: 96 % O2 Device: None (Room air)  Physical Exam: General: Well-appearing, no acute distress HENT: [place], AT Eyes: EOMI, no scleral icterus Respiratory: Clear to auscultation bilaterally.  No crackles, wheezing or rales Cardiovascular: RRR, -M/R/G, no JVD Extremities:-Edema,-tenderness Neuro: AAO x4, CNII-XII grossly intact Psych: Normal mood, normal affect   Data Reviewed:  Imaging: CXR 02/25/13 - Mild peribronchial thickening  PFT: None on file  Labs: CBC    Component Value Date/Time   WBC 8.0 02/25/2013 1235   RBC 4.85 02/25/2013 1235   HGB 13.6 02/25/2013 1235   HCT 41.0 02/25/2013 1235   PLT 257 02/25/2013 1235   MCV 84.5 02/25/2013 1235   MCH 28.0 02/25/2013 1235   MCHC 33.2 02/25/2013 1235   RDW 13.8 02/25/2013 1235   LYMPHSABS 1.2 02/25/2013 1235   MONOABS 0.5 02/25/2013 1235   EOSABS 0.2 02/25/2013 1235   BASOSABS 0.0 02/25/2013 1235   Normal Hg    Assessment & Plan:   Discussion: 75 year old female never smoker with prior history of asthma, atrial fibrillation, HTN, HLD, hx left MCA embolic stroke in 123456 who presents for shortness of breath. Patient is not in asthma exacerbation however her progressive shortness of breath and wheezing are concerning for uncontrolled asthma that may require a step up in bronchodilator therapy. Counseled on bronchodilator management and benefit of pulmonary rehab.  She would like time to consider before committing to program.   Shortness of breath --Agree with Cardiology evaluation --Management of asthma as noted below  Mild persistent asthma, not in exacerbation --INCREASE Advair to 500-50 mcg ONE puff TWICE a day --ARRANGE for pulmonary function test --Discussed pulmonary rehab. Patient declined for now. Please contact our office if you wish for referral to [place] vs [place] location to be placed  Health Maintenance Immunization History  Administered Date(s) Administered   Fluad Quad(high Dose 65+) 11/27/2020  Reports she is up to date on covid vaccinations. Administer influenza vaccine today  CT Lung Screen - not qualified. Never smoker  No orders of the defined types were placed in this encounter. Meds ordered this encounter  Medications   fluticasone-salmeterol (ADVAIR DISKUS) 500-50 MCG/ACT AEPB    Sig: Inhale 1 puff into the  lungs in the morning and at bedtime.    Dispense:  60 each    Refill:  5    Return in about 20 days (around 12/17/2020).  I have spent a total time of 45-minutes on the day of the appointment reviewing prior documentation, coordinating care and discussing medical diagnosis and plan with the patient/family. Imaging, labs and tests included in this note have been reviewed and interpreted independently by me.  [place], MD [facility] 11/27/2020 9:51 AM  Office Number [phone]

## 2020-11-27 NOTE — Patient Instructions (Addendum)
Medication Instructions:  Your physician recommends that you continue on your current medications as directed. Please refer to the Current Medication list given to you today.  *If you need a refill on your cardiac medications before your next appointment, please call your pharmacy*  Lab Work: Your physician recommends that you return for lab work TODAY:  BMET STAT Troponin  If you have labs (blood work) drawn today and your tests are completely normal, you will receive your results only by: [place] (if you have MyChart) OR A paper copy in the mail If you have any lab test that is abnormal or we need to change your treatment, we will call you to review the results.  Testing/Procedures: Non-Cardiac CT Angiography (CTA), is a special type of CT scan that uses a computer to produce multi-dimensional views of major blood vessels throughout the body. In CT angiography, a contrast material is injected through an IV to help visualize the blood vessels   Please schedule for 1-2 weeks    Your physician has requested that you have an echocardiogram. Echocardiography is a painless test that uses sound waves to create images of your heart. It provides your doctor with information about the size and shape of your heart and how well your heart's chambers and valves are working. This procedure takes approximately one hour. There are no restrictions for this procedure.  Please schedule for 2-3 weeks at the [facility] office   Follow-Up: At [facility], you and your health needs are our priority.  As part of our continuing mission to provide you with exceptional heart care, we have created designated Provider Care Teams.  These Care Teams include your primary Cardiologist (physician) and Advanced Practice Providers (APPs -  Physician Assistants and Nurse Practitioners) who all work together to provide you with the care you need, when you need it.  We recommend signing up for the patient portal  called "MyChart".  Sign up information is provided on this After Visit Summary.  MyChart is used to connect with patients for Virtual Visits (Telemedicine).  Patients are able to view lab/test results, encounter notes, upcoming appointments, etc.  Non-urgent messages can be sent to your provider as well.   To learn more about what you can do with MyChart, go to [email or web address].    Your next appointment:   3 month(s)  The format for your next appointment:   In Person  Provider:   Quay Burow, MD  Other Instructions

## 2020-11-27 NOTE — Progress Notes (Signed)
Cardiology Office Note:    Date:  11/27/2020   ID:  Sophia Frederick, DOB 17-Feb-1946, MRN [id number]  PCP:  Sophia Melter, MD   [facility] Providers Cardiologist:  Quay Burow, MD     Referring MD: Sophia Melter, MD   Chief Complaint  Patient presents with   Follow-up    Seen for Dr. Gwenlyn Frederick    History of Present Illness:    Sophia Frederick is a 75 y.o. female with a hx of hypertension, hyperlipidemia, PAF and stroke.  She has never had a heart attack in the past. She was admitted at [facility] in March 2022 with stroke and Frederick to have atrial fibrillation with RVR.  CT of the head was normal, however MRI showed small scattered acute/recent infarct in the left medial periatrial lobe as well as remote left frontal lobe white matter infarct and mild chronic small vessel ischemic changes.  She was converted to sinus rhythm.  Work-up included CTA of her neck showed no significant carotid disease.  Echocardiogram showed normal EF 55 to 60% with mild to moderate LAE, mild AI, no thrombus.  During the hospitalization, even despite rate control, patient was going in and out of atrial fibrillation and therefore was started on Eliquis.  Although patient had a prior history of intolerance of statin medication, she was switched to Crestor upon discharge.  LDL was 150 in the hospital.  During the previous office visit on 10/30/2020, I discussed with her possibility of adding on Zetia, however she was hesitant.  She described dyspnea on exertion for the past several weeks and then noted a spot in between his shoulder blade in the upper back that has been hurting whenever she walks around or lift heavy object.  The location does not feel painful with palpation on body rotation.  Symptom was concerning for angina.  I went ahead and ordered nuclear stress test to rule out angina.  Nuclear stress test performed on 11/08/2020 showed EF 56%, normal perfusion with no significant sign of reversible  ischemia.  Patient presents today for follow-up.  She continues to have shortness of breath with exertion.  She has been evaluated by Dr. Ebony Hail of pulmonology service for mild persistent asthma.  Talking with her, she continued to have intermittent back pain with exertion as well.  The back pain is worse when she is sitting up and do feel better when she is laying down.  Since waking up this morning, she has been having persistent midthoracic back pain between the shoulder blade for the past several hours.  We obtained EKG in the office that did not show any acute changes.  I ended up discussing her case with Sophia Frederick.  I am not entirely sure if her symptom is related to CAD versus aortic dissection versus a thoracic spinal issue.  She has a very reassuring nuclear stress test.  However as a precaution, I will obtain a stat troponin in the office today.  If troponin is high, she will need to go to the hospital.  I will also order a CTA to look for dissection and potentially spinal issue as well.  We will also order limited echocardiogram to look for ejection fraction and wall motion abnormality.  Past Medical History:  Diagnosis Date   A-fib ([place])    Asthma    Hypercholesteremia    Hypertension    Stroke [facility])     Past Surgical History:  Procedure Laterality Date   BREAST REDUCTION SURGERY  2003   HYSTERECTOMY ABDOMINAL WITH SALPINGECTOMY  1989    Current Medications: Current Meds  Medication Sig   albuterol (VENTOLIN HFA) 108 (90 Base) MCG/ACT inhaler Inhale into the lungs every 6 (six) hours as needed for wheezing or shortness of breath.   apixaban (ELIQUIS) 5 MG TABS tablet Take 5 mg by mouth 2 (two) times daily.   cholecalciferol (VITAMIN D3) 25 MCG (1000 UNIT) tablet Take 1,000 Units by mouth daily.   co-enzyme Q-10 30 MG capsule Take 30 mg by mouth 3 (three) times daily.   diltiazem (DILACOR XR) 120 MG 24 hr capsule Take 120 mg by mouth daily.   esomeprazole (NEXIUM) 20 MG  capsule Take 20 mg by mouth daily at 12 noon.   fluticasone-salmeterol (ADVAIR DISKUS) 500-50 MCG/ACT AEPB Inhale 1 puff into the lungs in the morning and at bedtime.   Krill Oil (OMEGA-3) 500 MG CAPS Take by mouth.   lisinopril (ZESTRIL) 10 MG tablet Take 10 mg by mouth daily.   Multiple Vitamins-Minerals (MULTIVITAMIN WITH MINERALS) tablet Take 1 tablet by mouth daily.   rosuvastatin (CRESTOR) 20 MG tablet Take 20 mg by mouth at bedtime.     Allergies:   Levaquin [levofloxacin in d5w]   Social History   Socioeconomic History   Marital status: Married    Spouse name: Sophia Frederick   Number of children: Not on file   Years of education: Not on file   Highest education level: Not on file  Occupational History   Not on file  Tobacco Use   Smoking status: Never   Smokeless tobacco: Never  Substance and Sexual Activity   Alcohol use: No   Drug use: No   Sexual activity: Not on file  Other Topics Concern   Not on file  Social History Narrative   Lives at home with Sophia Frederick husband   Right Handed   Drinks caffeine seldomly   Social Determinants of Health   Financial Resource Strain: Not on file  Food Insecurity: Not on file  Transportation Needs: Not on file  Physical Activity: Not on file  Stress: Not on file  Social Connections: Not on file     Family History: The patient's family history includes Asthma in an other family member.  ROS:   Please see the history of present illness.     All other systems reviewed and are negative.  EKGs/Labs/Other Studies Reviewed:    The following studies were reviewed today:  Myoview 11/08/2020   The study is normal. The study is low risk.   No ST deviation was noted.   LV perfusion is normal.   Nuclear stress EF: 56 %. The left ventricular ejection fraction is normal (55-65%). Left ventricular function is normal. End diastolic cavity size is normal.   Low risk stress nuclear study with normal perfusion and normal left ventricular  regional and global systolic function.  EKG:  EKG is ordered today.  The ekg ordered today demonstrates normal sinus rhythm, no significant ST-T wave changes  Recent Labs: 11/27/2020: Hemoglobin 13.9; Platelets 256.0  Recent Lipid Panel    Component Value Date/Time   CHOL 173 07/18/2020 1107   TRIG 199 (H) 07/18/2020 1107   HDL 40 07/18/2020 1107   CHOLHDL 4.3 07/18/2020 1107   LDLCALC 99 07/18/2020 1107     Risk Assessment/Calculations:    CHA2DS2-VASc Score = 6   This indicates a 9.7% annual risk of stroke. The patient's score is based upon: CHF History: 0 HTN History: 1 Diabetes  History: 0 Stroke History: 2 Vascular Disease History: 0 Age Score: 2 Gender Score: 1          Physical Exam:    VS:  BP 128/70 (BP Location: Left Arm, Patient Position: Sitting, Cuff Size: Normal)   Pulse 64   Resp 20   Ht '5\' 1"'$  (1.549 m)   Wt 190 lb 8 oz (86.4 kg)   SpO2 95%   BMI 35.99 kg/m     Wt Readings from Last 3 Encounters:  11/27/20 190 lb 8 oz (86.4 kg)  11/27/20 191 lb 12.8 oz (87 kg)  11/08/20 189 lb (85.7 kg)     GEN:  Well nourished, well developed in no acute distress HEENT: Normal NECK: No JVD; No carotid bruits LYMPHATICS: No lymphadenopathy CARDIAC: RRR, no murmurs, rubs, gallops RESPIRATORY:  Clear to auscultation without rales, wheezing or rhonchi  ABDOMEN: Soft, non-tender, non-distended MUSCULOSKELETAL:  No edema; No deformity  SKIN: Warm and dry NEUROLOGIC:  Alert and oriented x 3 PSYCHIATRIC:  Normal affect   ASSESSMENT:    1. Acute midline thoracic back pain   2. Shortness of breath    PLAN:    In order of problems listed above:  Acute mid back pain: Concerning feature of her symptom is her mid back pain occurs more so with physical exertion when she was walking around.  Previously only occur with physical activity, she woke up this morning and has been having persistent pain even at rest.  She had a normal echocardiogram about 6 months ago.   I obtained a Myoview on 11/08/2020 that was entirely normal.  Given persistent pain from this morning, I will obtain stat troponin, if elevated, she will need to go to the hospital.  Otherwise if negative, I plan to obtain CT of chest abdomen pelvis to rule out aortic dissection and also take a look at the thoracic spine as well.  The case has been discussed with Sophia Frederick.  Shortness of breath with exertion: She has been noticing increasing shortness of breath with exertion lately.  She had a echocardiogram obtained in March 2022 and [facility] hospital that showed normal ejection fraction.  She saw pulmonology service this morning and is undergoing work-up for asthma.  PAF: Current on Eliquis.  Maintaining sinus rhythm based on EKG.  On diltiazem 120 mg daily.  Hypertension: Blood pressure well controlled  Hyperlipidemia: On Crestor 20 mg daily.  History of CVA: Previous MRI obtained at [place] earlier this year showed scattered infarct.     Medication Adjustments/Labs and Tests Ordered: Current medicines are reviewed at length with the patient today.  Concerns regarding medicines are outlined above.  Orders Placed This Encounter  Procedures   CT ANGIO CHEST/ABD/PEL FOR DISSECTION W &/OR WO CONTRAST   Basic metabolic panel   Troponin T   EKG 12-Lead   ECHOCARDIOGRAM LIMITED   No orders of the defined types were placed in this encounter.   Patient Instructions  Medication Instructions:  Your physician recommends that you continue on your current medications as directed. Please refer to the Current Medication list given to you today.  *If you need a refill on your cardiac medications before your next appointment, please call your pharmacy*  Lab Work: Your physician recommends that you return for lab work TODAY:  BMET STAT Troponin  If you have labs (blood work) drawn today and your tests are completely normal, you will receive your results only by: [place] (if  you have [place])  OR A paper copy in the mail If you have any lab test that is abnormal or we need to change your treatment, we will call you to review the results.  Testing/Procedures: Non-Cardiac CT Angiography (CTA), is a special type of CT scan that uses a computer to produce multi-dimensional views of major blood vessels throughout the body. In CT angiography, a contrast material is injected through an IV to help visualize the blood vessels   Please schedule for 1-2 weeks    Your physician has requested that you have an echocardiogram. Echocardiography is a painless test that uses sound waves to create images of your heart. It provides your doctor with information about the size and shape of your heart and how well your heart's chambers and valves are working. This procedure takes approximately one hour. There are no restrictions for this procedure.  Please schedule for 2-3 weeks at the [facility] office   Follow-Up: At [facility], you and your health needs are our priority.  As part of our continuing mission to provide you with exceptional heart care, we have created designated Provider Care Teams.  These Care Teams include your primary Cardiologist (physician) and Advanced Practice Providers (APPs -  Physician Assistants and Nurse Practitioners) who all work together to provide you with the care you need, when you need it.  We recommend signing up for the patient portal called "MyChart".  Sign up information is provided on this After Visit Summary.  MyChart is used to connect with patients for Virtual Visits (Telemedicine).  Patients are able to view lab/test results, encounter notes, upcoming appointments, etc.  Non-urgent messages can be sent to your provider as well.   To learn more about what you can do with MyChart, go to [email or web address].    Your next appointment:   3 month(s)  The format for your next appointment:   In Person  Provider:   Quay Burow, MD  Other  Instructions    Signed, Almyra Deforest, [place]  11/27/2020 6:06 PM    [place]

## 2020-11-28 LAB — BASIC METABOLIC PANEL
BUN/Creatinine Ratio: 20 (ref 12–28)
BUN: 25 mg/dL (ref 8–27)
CO2: 23 mmol/L (ref 20–29)
Calcium: 10.3 mg/dL (ref 8.7–10.3)
Chloride: 106 mmol/L (ref 96–106)
Creatinine, Ser: 1.24 mg/dL — ABNORMAL HIGH (ref 0.57–1.00)
Glucose: 86 mg/dL (ref 65–99)
Potassium: 5.1 mmol/L (ref 3.5–5.2)
Sodium: 145 mmol/L — ABNORMAL HIGH (ref 134–144)
eGFR: 45 mL/min/{1.73_m2} — ABNORMAL LOW (ref >59)

## 2020-11-28 LAB — IGE: IgE (Immunoglobulin E), Serum: 403 kU/L — ABNORMAL HIGH (ref <=114)

## 2020-11-28 NOTE — Progress Notes (Signed)
See separate message along with the BMET

## 2020-11-28 NOTE — Progress Notes (Signed)
Mildly dehydrated, make sure increase fluid intake. I previously left a message around 6:30PM last night informing her that her stat troponin came back negative suggest her prolonged back pain yesterday was non-cardiac in nature.

## 2020-12-11 ENCOUNTER — Other Ambulatory Visit: Payer: Self-pay

## 2020-12-11 ENCOUNTER — Ambulatory Visit (HOSPITAL_COMMUNITY)
Admission: RE | Admit: 2020-12-11 | Discharge: 2020-12-11 | Disposition: A | Discharge status: Home / Self Care | Payer: PPO | Source: Ambulatory Visit | Attending: Physician Assistant | Admitting: Physician Assistant

## 2020-12-11 DIAGNOSIS — N281 Cyst of kidney, acquired: Secondary | ICD-10-CM | POA: Diagnosis not present

## 2020-12-11 DIAGNOSIS — M546 Pain in thoracic spine: Secondary | ICD-10-CM | POA: Diagnosis not present

## 2020-12-11 DIAGNOSIS — I2510 Atherosclerotic heart disease of native coronary artery without angina pectoris: Secondary | ICD-10-CM | POA: Diagnosis not present

## 2020-12-11 DIAGNOSIS — I700 Atherosclerosis of aorta: Secondary | ICD-10-CM | POA: Diagnosis not present

## 2020-12-11 DIAGNOSIS — I898 Other specified noninfective disorders of lymphatic vessels and lymph nodes: Secondary | ICD-10-CM | POA: Diagnosis not present

## 2020-12-11 MED ORDER — IOHEXOL 350 MG/ML SOLN
80.0000 mL | Freq: Once | INTRAVENOUS | Status: AC | PRN
  Administered 2020-12-11: 80 mL via INTRAVENOUS

## 2020-12-12 ENCOUNTER — Other Ambulatory Visit: Payer: Self-pay | Admitting: Physician Assistant

## 2020-12-12 DIAGNOSIS — E041 Nontoxic single thyroid nodule: Secondary | ICD-10-CM

## 2020-12-12 NOTE — Progress Notes (Signed)
I spoke with Mrs. Sophia Frederick regarding the CT result, no significant vascular abnormality, but a 2 cm thyroid nodule was noted. Please order a thyroid U/S to assess the nodule. I have placed the order for the ultrasound

## 2020-12-13 ENCOUNTER — Other Ambulatory Visit: Payer: Self-pay | Admitting: *Deleted

## 2020-12-13 DIAGNOSIS — J4530 Mild persistent asthma, uncomplicated: Secondary | ICD-10-CM

## 2020-12-17 ENCOUNTER — Other Ambulatory Visit: Payer: Self-pay

## 2020-12-17 ENCOUNTER — Ambulatory Visit: Payer: PPO | Admitting: Pulmonary Disease

## 2020-12-17 ENCOUNTER — Ambulatory Visit (INDEPENDENT_AMBULATORY_CARE_PROVIDER_SITE_OTHER): Payer: PPO | Admitting: Pulmonary Disease

## 2020-12-17 ENCOUNTER — Encounter: Payer: Self-pay | Admitting: Pulmonary Disease

## 2020-12-17 VITALS — BP 130/70 | HR 88 | Temp 98.0°F | Ht 61.0 in | Wt 193.0 lb

## 2020-12-17 DIAGNOSIS — J4530 Mild persistent asthma, uncomplicated: Secondary | ICD-10-CM

## 2020-12-17 DIAGNOSIS — R0602 Shortness of breath: Secondary | ICD-10-CM

## 2020-12-17 LAB — PULMONARY FUNCTION TEST
DL/VA % pred: 112 %
DL/VA: 4.71 ml/min/mmHg/L
DLCO cor % pred: 101 %
DLCO cor: 17.35 ml/min/mmHg
DLCO unc % pred: 102 %
DLCO unc: 17.61 ml/min/mmHg
FEF 25-75 Post: 1.16 L/sec
FEF 25-75 Pre: 1.25 L/sec
FEF2575-%Change-Post: -7 %
FEF2575-%Pred-Post: 80 %
FEF2575-%Pred-Pre: 87 %
FEV1-%Change-Post: 0 %
FEV1-%Pred-Post: 98 %
FEV1-%Pred-Pre: 97 %
FEV1-Post: 1.76 L
FEV1-Pre: 1.75 L
FEV1FVC-%Change-Post: 0 %
FEV1FVC-%Pred-Pre: 97 %
FEV6-%Change-Post: 0 %
FEV6-%Pred-Post: 105 %
FEV6-%Pred-Pre: 105 %
FEV6-Post: 2.42 L
FEV6-Pre: 2.41 L
FEV6FVC-%Pred-Post: 105 %
FEV6FVC-%Pred-Pre: 105 %
FVC-%Change-Post: 0 %
FVC-%Pred-Post: 100 %
FVC-%Pred-Pre: 99 %
FVC-Post: 2.42 L
FVC-Pre: 2.41 L
Post FEV1/FVC ratio: 73 %
Post FEV6/FVC ratio: 100 %
Pre FEV1/FVC ratio: 73 %
Pre FEV6/FVC Ratio: 100 %
RV % pred: 91 %
RV: 1.97 L
TLC % pred: 99 %
TLC: 4.56 L

## 2020-12-17 MED ORDER — DILTIAZEM HCL ER 120 MG PO CP24: 120.0000 mg | ORAL_CAPSULE | Freq: Every day | ORAL | 0 refills | Status: AC

## 2020-12-17 MED ORDER — FLUTICASONE-SALMETEROL 500-50 MCG/ACT IN AEPB: 1.0000 | INHALATION_SPRAY | Freq: Two times a day (BID) | RESPIRATORY_TRACT | 6 refills | Status: AC

## 2020-12-17 MED ORDER — MONTELUKAST SODIUM 10 MG PO TABS: 10.0000 mg | ORAL_TABLET | Freq: Every day | ORAL | 5 refills | Status: AC

## 2020-12-17 NOTE — Progress Notes (Signed)
Subjective:   PATIENT ID: Sophia Frederick GENDER: female DOB: 06-08-1945, MRN: [id number]   HPI  Chief Complaint  Patient presents with   Follow-up    PFT  performed today.  SOB has improved since medication changed but still experiences it. Wheezing when walks.   Reason for Visit: Follow-up for shortness of breath  [name] is a 75 year old female never smoker with prior history of asthma, atrial fibrillation, HTN, HLD, hx left MCA embolic stroke in 05/4740 who presents for follow-up.  Synopsis: She was diagnosed with asthma in her 29s. After moving from [place] to [place] in 1996. She has not had an exacerbation in over 5-10 years. However since her stroke in March, she has felt decreased energy and activity since then. She reported asthma flare 1-2 weeks after hospital discharge. In September 2022 she has worsening shortness of breath and wheezing. She wants to go back walking with her family. Her hips and energy limit her activity.  12/17/20 Since our last visit, she reports improved shortness of breath and wheezing after increasing her Advair strength to 500 mcg twice daily. Minimal cough. Overall reports 50% improvement. Reports she is able to do more activity. Symptoms recently worsened with cold weather changes.   Social History: Never smoker  Son in Sports coach - family medicine doctor  Asthma Control Test ACT Total Score  11/27/2020 13    Past Medical History:  Diagnosis Date   A-fib ([place])    Asthma    Hypercholesteremia    Hypertension    Stroke ([place])      Family History  Problem Relation Age of Onset   Asthma Other      Social History   Occupational History   Not on file  Tobacco Use   Smoking status: Never   Smokeless tobacco: Never  Substance and Sexual Activity   Alcohol use: No   Drug use: No   Sexual activity: Not on file    Allergies  Allergen Reactions   Levaquin [Levofloxacin In D5w]      Outpatient Medications Prior to Visit   Medication Sig Dispense Refill   albuterol (VENTOLIN HFA) 108 (90 Base) MCG/ACT inhaler Inhale into the lungs every 6 (six) hours as needed for wheezing or shortness of breath.     apixaban (ELIQUIS) 5 MG TABS tablet Take 5 mg by mouth 2 (two) times daily.     cholecalciferol (VITAMIN D3) 25 MCG (1000 UNIT) tablet Take 1,000 Units by mouth daily.     co-enzyme Q-10 30 MG capsule Take 30 mg by mouth 3 (three) times daily.     diltiazem (DILACOR XR) 120 MG 24 hr capsule Take 120 mg by mouth daily.     esomeprazole (NEXIUM) 20 MG capsule Take 20 mg by mouth daily at 12 noon.     fluticasone-salmeterol (ADVAIR DISKUS) 500-50 MCG/ACT AEPB Inhale 1 puff into the lungs in the morning and at bedtime. 60 each 5   Krill Oil (OMEGA-3) 500 MG CAPS Take by mouth.     lisinopril (ZESTRIL) 10 MG tablet Take 10 mg by mouth daily.     Multiple Vitamins-Minerals (MULTIVITAMIN WITH MINERALS) tablet Take 1 tablet by mouth daily.     rosuvastatin (CRESTOR) 20 MG tablet Take 20 mg by mouth at bedtime.     No facility-administered medications prior to visit.    Review of Systems  Constitutional:  Negative for chills, diaphoresis, fever, malaise/fatigue and weight loss.  HENT:  Negative for congestion.   Respiratory:  Positive for shortness of breath and wheezing. Negative for cough, hemoptysis and sputum production.   Cardiovascular:  Negative for chest pain, palpitations and leg swelling.    Objective:   Vitals:   12/17/20 1410  BP: 130/70  Pulse: 88  Temp: 98 F (36.7 C)  TempSrc: Oral  SpO2: 94%  Weight: 193 lb (87.5 kg)  Height: 5\' 1"  (1.549 m)      Physical Exam: General: Well-appearing, no acute distress HENT: [place], AT Eyes: EOMI, no scleral icterus Respiratory: Clear to auscultation bilaterally.  No crackles, wheezing or rales Cardiovascular: RRR, -M/R/G, no JVD Extremities:-Edema,-tenderness Neuro: AAO x4, CNII-XII grossly intact Psych: Normal mood, normal affect  Data  Reviewed:  Imaging: CXR 02/25/13 - Mild peribronchial thickening CTA 12/11/20 - Calcified mediastinal and hilar lymph nodes concerning from prior granulamtous disease  PFT: 12/17/20 FVC 2.42 (100%) FEV1 1.76 (98%) Ratio 73  TLC 99% DLCO 102% Interpretation: Mild obstructive defect is present. No significant bronchodilator response however does not preclude benefit of bronchodilators. Normal gas exchange. F-V loops suggest obstructive defect.  Labs: CBC    Component Value Date/Time   WBC 11.3 (H) 11/27/2020 1046   RBC 4.80 11/27/2020 1046   HGB 13.9 11/27/2020 1046   HCT 41.5 11/27/2020 1046   PLT 256.0 11/27/2020 1046   MCV 86.5 11/27/2020 1046   MCH 28.0 02/25/2013 1235   MCHC 33.4 11/27/2020 1046   RDW 13.8 11/27/2020 1046   LYMPHSABS 2.1 11/27/2020 1046   MONOABS 0.8 11/27/2020 1046   EOSABS 0.4 11/27/2020 1046   BASOSABS 0.1 11/27/2020 1046   Absolute eos 11/27/20 - 400  Assessment & Plan:   Discussion: 75 year old female never smoker with prior history of asthma, atrial fibrillation, HTN, HLD, hx left MCA embolic stroke in 09/4942 who presents for follow-up. Increased ICS improved symptoms however still persistent.  Discussed biologic agents including Xolair (anti-IgE), Nucala (anti-IL-5), Fasenra (anti-IL-5 receptor alpha) and Dupixent (anti-IL-4 receptor subunit alpha). I believe patient would benefit from biologic agent given uncontrolled symptoms, multiple exacerbations and eosinophilia. We discussed the risks and benefits of these type of medications including anaphylaxis. Patient expressed understanding and would like to pursue treatment if eligible.  Shortness of breath --Continues with Cardiology evaluation --Management of asthma as noted below  Mild persistent asthma, not in exacerbation, symptomatic --CONTINUE Advair to 500-50 mcg ONE puff TWICE a day --START montelukast 10 mg daily --Discussed pulmonary rehab. Patient declined for now. Please contact our  office if you wish for referral to [place] vs [place] location to be placed  Health Maintenance Immunization History  Administered Date(s) Administered   Fluad Quad(high Dose 65+) 11/27/2020   Moderna Sars-Covid-2 Vaccination 04/18/2019, 05/17/2019   PFIZER Comirnaty(Gray Top)Covid-19 Tri-Sucrose Vaccine 02/15/2020, 07/24/2020   CT Lung Screen - not qualified. Never smoker  No orders of the defined types were placed in this encounter. Meds ordered this encounter  Medications   montelukast (SINGULAIR) 10 MG tablet    Sig: Take 1 tablet (10 mg total) by mouth at bedtime.    Dispense:  60 tablet    Refill:  5   diltiazem (DILACOR XR) 120 MG 24 hr capsule    Sig: Take 1 capsule (120 mg total) by mouth daily.    Dispense:  30 capsule    Refill:  0   fluticasone-salmeterol (ADVAIR DISKUS) 500-50 MCG/ACT AEPB    Sig: Inhale 1 puff into the lungs in the morning and at bedtime.  Dispense:  60 each    Refill:  6   Return in about 6 months (around 06/17/2021).  I have spent a total time of 33-minutes on the day of the appointment reviewing prior documentation, coordinating care and discussing medical diagnosis and plan with the patient/family. Past medical history, allergies, medications were reviewed. Pertinent imaging, labs and tests included in this note have been reviewed and interpreted independently by me.  [place], MD [facility] 12/17/2020 12:38 PM  Office Number [phone]

## 2020-12-17 NOTE — Progress Notes (Signed)
Full PFT performed today. °

## 2020-12-17 NOTE — Patient Instructions (Signed)
Discussed biologic agents including Xolair (anti-IgE), Nucala (anti-IL-5), Fasenra (anti-IL-5 receptor alpha) and Dupixent (anti-IL-4 receptor subunit alpha). I believe patient would benefit from biologic agent given uncontrolled symptoms, multiple exacerbations and eosinophilia. We discussed the risks and benefits of these type of medications including anaphylaxis. Patient expressed understanding and would like to pursue treatment if eligible.  Shortness of breath --Continues with Cardiology evaluation --Management of asthma as noted below  Mild persistent asthma, not in exacerbation, symptomatic --CONTINUE Advair to 500-50 mcg ONE puff TWICE a day --START montelukast 10 mg daily --Discussed pulmonary rehab. Patient declined for now. Please contact our office if you wish for referral to [place] vs [place] location to be placed  Follow-up with me in 6 months

## 2020-12-17 NOTE — Patient Instructions (Signed)
Full PFT performed today. °

## 2020-12-18 ENCOUNTER — Ambulatory Visit (HOSPITAL_COMMUNITY): Payer: PPO | Attending: Cardiology

## 2020-12-18 DIAGNOSIS — R0602 Shortness of breath: Secondary | ICD-10-CM | POA: Insufficient documentation

## 2020-12-18 LAB — ECHOCARDIOGRAM LIMITED
Area-P 1/2: 4.6 cm2
P 1/2 time: 936 msec
S' Lateral: 3 cm

## 2020-12-19 NOTE — Progress Notes (Signed)
Great pumping function of heart, mild mitral and aortic valve leakage. Nothing significant. Wall motion of heart is normal which argues against any severe blockage. When combined with the recent nuclear stress test, the results are quite reassuring.

## 2020-12-20 ENCOUNTER — Ambulatory Visit
Admission: RE | Admit: 2020-12-20 | Discharge: 2020-12-20 | Disposition: A | Discharge status: Home / Self Care | Payer: PPO | Source: Ambulatory Visit | Attending: Physician Assistant | Admitting: Physician Assistant

## 2020-12-20 DIAGNOSIS — E041 Nontoxic single thyroid nodule: Secondary | ICD-10-CM

## 2020-12-20 DIAGNOSIS — E042 Nontoxic multinodular goiter: Secondary | ICD-10-CM | POA: Diagnosis not present

## 2020-12-28 DIAGNOSIS — Z0000 Encounter for general adult medical examination without abnormal findings: Secondary | ICD-10-CM | POA: Diagnosis not present

## 2020-12-28 DIAGNOSIS — E782 Mixed hyperlipidemia: Secondary | ICD-10-CM | POA: Diagnosis not present

## 2020-12-28 DIAGNOSIS — I10 Essential (primary) hypertension: Secondary | ICD-10-CM | POA: Diagnosis not present

## 2020-12-28 DIAGNOSIS — I639 Cerebral infarction, unspecified: Secondary | ICD-10-CM | POA: Diagnosis not present

## 2020-12-28 DIAGNOSIS — J45909 Unspecified asthma, uncomplicated: Secondary | ICD-10-CM | POA: Diagnosis not present

## 2021-01-04 ENCOUNTER — Other Ambulatory Visit: Payer: Self-pay

## 2021-01-04 DIAGNOSIS — E041 Nontoxic single thyroid nodule: Secondary | ICD-10-CM

## 2021-02-19 ENCOUNTER — Ambulatory Visit: Payer: PPO | Admitting: Cardiovascular Disease

## 2021-05-13 ENCOUNTER — Encounter: Payer: Self-pay | Admitting: Adult Health

## 2021-05-13 ENCOUNTER — Ambulatory Visit: Payer: PPO | Admitting: Adult Health

## 2021-05-13 VITALS — BP 158/94 | HR 64 | Ht 61.0 in | Wt 183.0 lb

## 2021-05-13 DIAGNOSIS — I63412 Cerebral infarction due to embolism of left middle cerebral artery: Secondary | ICD-10-CM

## 2021-05-13 DIAGNOSIS — I480 Paroxysmal atrial fibrillation: Secondary | ICD-10-CM | POA: Diagnosis not present

## 2021-05-13 NOTE — Progress Notes (Signed)
[facility] [address] [place]. [place] [address] [phone]       STROKE FOLLOW UP NOTE  [name] Date of Birth:  12-03-1945 Medical Record Number:  [id number]   Referring MD: Carolee Rota, NP Reason for Referral: Stroke  Chief Complaint  Patient presents with   Follow-up    RM 3 alone Pt is well and stable. Has had a few dizzy spells but no new concerns.       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  Overall stable from stroke standpoint without new or reoccurring stroke/TIA symptoms.  Compliant on Eliquis without side effects.  Compliant on Crestor - does report frequent bilat hand foot cramping and questions if this is from statin use, does report ensuring adequate fluid intake over the past few months but has not noticed any improvement of cramping.  Blood pressure today 158/94.  Routinely monitors at home but notes over the past few months, readings have been slightly higher.  Has f/u visit with cards next month. Does report occasional dizziness upon getting out of bed in the morning but quickly resolves.  No further concerns at this time.     History provided for reference purposes only Update 11/05/2020 JM: Sophia Frederick returns for 74-month stroke follow-up unaccompanied.  Overall stable.  Denies new or reoccurring stroke/TIA symptoms.  Compliant on Eliquis and Crestor without side effects.  Blood pressure today 148/82. Routinely monitors at home - typically 120-130s/80s.  Chronic headaches (left eye and frontal) present for "many years" stable. Frontal headaches mild - she believes allergy/sinus related. Left eye pain headache short lasting duration - denies visual changes, photophobia, phonophobia or N/V. Reports present since sinus surgery over 12 years ago.   Of note, being worked up by cardiology for possible angina with plans on undergoing stress test this  Thursday. Reports extensive family cardiac history.  Also c/o shortness of breath - has initial eval with pulm 9/13.  Lab work completed by PCP recently which was satisfactory (unable to view via epic).   No further concerns at this time  Consult visit 07/18/2020 Dr. Leonie Man: Sophia Frederick is a pleasant 22 Caucasian lady seen today for initial office consultation visit for stroke.  History is obtained from the patient and review of electronic medical records in [place].  I was not able to view actual images in PACS but reports were available.  She has past medical history of hypertension, hyperlipidemia and asthma.  She presented to her primary care physician's office on 05/21/2020 for sudden onset of headache, some memory difficulties and right-sided peripheral vision loss.  She was found to be in new onset atrial fibrillation with rapid ventricular rate and was referred to the emergency room.  She reported that for a week prior she had been having episodic spells where she was not feeling right and had difficulty remembering where things were in the kitchen and word finding difficulties and difficulty walking.  She also had a mild to moderate headache which is not disabling.  In the PCPs office heart rate was 140 and she was in atrial fibrillation.  She was treated in the ER with Cardizem infusion CT scan of the head was unremarkable.  CT angiogram of the brain and neck did not reveal any large vessel intracranial extracranial stenosis or occlusion.  Subsequently MRI was obtained which shows scattered left medial parietal embolic infarcts and remote age left  frontal white matter infarct and changes of small vessel disease.  Echocardiogram showed normal ejection fraction without thrombus.  She is started on Eliquis for anticoagulation and Crestor for elevated lipids and medication for hypertension.  LDL cholesterol was 150 mg percent and hemoglobin A1c was 5.5.  Patient states she is done well since discharge.   She is tolerating Eliquis well with only minor bruising but no bleeding.  She has an upcoming appointment to see Dr. Alvester Chou cardiologist for her A. fib.  Memory is improved.  She is no longer has any vision difficulties.  She has no new complaints.  Her blood pressure is better controlled at home though today it is elevated in office at 168/80.  She is tolerating Crestor well without muscle aches and pains.  She has not had any follow-up lipid profile checked  ROS:   14 system review of systems is positive for those listed in HPI and all other systems negative  PMH:  Past Medical History:  Diagnosis Date   A-fib ([place])    Asthma    Hypercholesteremia    Hypertension    Stroke [facility])     Social History:  Social History   Socioeconomic History   Marital status: Married    Spouse name: Shanon Brow   Number of children: Not on file   Years of education: Not on file   Highest education level: Not on file  Occupational History   Not on file  Tobacco Use   Smoking status: Never   Smokeless tobacco: Never  Substance and Sexual Activity   Alcohol use: No   Drug use: No   Sexual activity: Not on file  Other Topics Concern   Not on file  Social History Narrative   Lives at home with Shanon Brow husband   Right Handed   Drinks caffeine seldomly   Social Determinants of Health   Financial Resource Strain: Not on file  Food Insecurity: Not on file  Transportation Needs: Not on file  Physical Activity: Not on file  Stress: Not on file  Social Connections: Not on file  Intimate Partner Violence: Not on file    Medications:   Current Outpatient Medications on File Prior to Visit  Medication Sig Dispense Refill   albuterol (VENTOLIN HFA) 108 (90 Base) MCG/ACT inhaler Inhale into the lungs every 6 (six) hours as needed for wheezing or shortness of breath.     apixaban (ELIQUIS) 5 MG TABS tablet Take 5 mg by mouth 2 (two) times daily.     cholecalciferol (VITAMIN D3) 25 MCG (1000 UNIT) tablet  Take 1,000 Units by mouth daily.     co-enzyme Q-10 30 MG capsule Take 200 mg by mouth daily.     diltiazem (DILACOR XR) 120 MG 24 hr capsule Take 1 capsule (120 mg total) by mouth daily. 30 capsule 0   esomeprazole (NEXIUM) 20 MG capsule Take 20 mg by mouth daily at 12 noon.     fluticasone-salmeterol (ADVAIR DISKUS) 500-50 MCG/ACT AEPB Inhale 1 puff into the lungs in the morning and at bedtime. 60 each 6   Krill Oil (OMEGA-3) 500 MG CAPS Take by mouth.     lisinopril (ZESTRIL) 10 MG tablet Take 10 mg by mouth daily.     montelukast (SINGULAIR) 10 MG tablet Take 1 tablet (10 mg total) by mouth at bedtime. 60 tablet 5   Multiple Vitamins-Minerals (MULTIVITAMIN WITH MINERALS) tablet Take 1 tablet by mouth daily.     rosuvastatin (CRESTOR) 20 MG tablet Take  20 mg by mouth at bedtime.     No current facility-administered medications on file prior to visit.    Allergies:   Allergies  Allergen Reactions   Levaquin [Levofloxacin In D5w]     Physical Exam Today's Vitals   05/13/21 0904  BP: (!) 158/94  Pulse: 64  Weight: 183 lb (83 kg)  Height: 5\' 1"  (1.549 m)   Body mass index is 34.58 kg/m.   General: well developed, well nourished very pleasant elderly Caucasian lady, seated, in no evident distress Head: head normocephalic and atraumatic.   Neck: supple with no carotid or supraclavicular bruits Cardiovascular: regular rate and rhythm, no murmurs Musculoskeletal: no deformity Skin:  no rash/petichiae Vascular:  Normal pulses all extremities  Neurologic Exam Mental Status: Awake and fully alert.  Fluent speech and language.  Oriented to place and time. Recent and remote memory intact. Attention span, concentration and fund of knowledge appropriate. Mood and affect appropriate.  Cranial Nerves: Pupils equal, briskly reactive to light. Extraocular movements full without nystagmus. Visual fields full to confrontation. Hearing intact. Facial sensation intact. Face, tongue, palate moves  normally and symmetrically.  Motor: Normal bulk and tone. Normal strength in all tested extremity muscles. Sensory.: intact to touch , pinprick , position and vibratory sensation.  Coordination: Rapid alternating movements normal in all extremities. Finger-to-nose and heel-to-shin performed accurately bilaterally. Gait and Station: Arises from chair without difficulty. Stance is normal. Gait demonstrates normal stride length and balance . Able to heel, toe and tandem walk with mild difficulty.  Reflexes: 1+ and symmetric. Toes downgoing.       ASSESSMENT/PLAN: 76 year old Caucasian lady with embolic left parietal MCA branch infarcts in March 2022 secondary to new diagnosis of atrial fibrillation without residual deficit.  Vascular risk factors of atrial fibrillation, hyperlipidemia , mild obesity, chronic headaches, and hypertension.     Continue Eliquis 5 mg twice daily and Crestor 20 mg daily for secondary stroke prevention and maintain strict control of hypertension with blood pressure goal below 130/90 and lipids with LDL cholesterol goal below 70 mg/dL.   Discussed holding statin for 2-3 days to see if cramping resolves. If resolves, advised to f/u with PCP or cardiology to discuss other treatment options. If cramping persists, advised to restart Crestor and f/u with PCP to look for other etiologies.  Continue routine follow-up with Dr. Gwenlyn Found cardiology for atrial fibrillation and Eliquis management     Doing well from stroke standpoint and risk factors are managed by PCP. She may follow up PRN, as usual for our patients who are strictly being followed for stroke. If any new neurological issues should arise, request PCP place referral for evaluation by one of our neurologists. Thank you.     CC:  Kathryne Eriksson, NP   I spent 34 minutes of face-to-face and non-face-to-face time with patient.  This included previsit chart review, lab review, study review, electronic health record  documentation, patient education and discussion regarding hx of stroke, secondary stroke prevention measures and aggressive stroke risk factor management, other concerns as noted above, and answered all other questions to patient's satisfaction  Frann Rider, [facility]  [facility] [address] [place], [place] [address]  Phone [phone] Fax [phone] Note: This document was prepared with digital dictation and possible smart phrase technology. Any transcriptional errors that result from this process are unintentional.

## 2021-05-13 NOTE — Patient Instructions (Signed)
Continue Eliquis (apixaban) daily  and Crestor for secondary stroke prevention  Continue routine follow-up with cardiology for atrial fibrillation and Eliquis management  Continue to follow up with PCP regarding cholesterol and blood pressure management  Maintain strict control of hypertension with blood pressure goal below 130/90 and cholesterol with LDL cholesterol (bad cholesterol) goal below 70 mg/dL.   Signs of a Stroke? Follow the BEFAST method:  Balance Watch for a sudden loss of balance, trouble with coordination or vertigo Eyes Is there a sudden loss of vision in one or both eyes? Or double vision?  Face: Ask the person to smile. Does one side of the face droop or is it numb?  Arms: Ask the person to raise both arms. Does one arm drift downward? Is there weakness or numbness of a leg? Speech: Ask the person to repeat a simple phrase. Does the speech sound slurred/strange? Is the person confused ? Time: If you observe any of these signs, call [phone].       Thank you for coming to see [place] at [facility]. I hope we have been able to provide you high quality care today.  You may receive a patient satisfaction survey over the next few weeks. We would appreciate your feedback and comments so that we may continue to improve ourselves and the health of our patients.

## 2021-05-17 ENCOUNTER — Other Ambulatory Visit: Payer: Self-pay

## 2021-05-17 ENCOUNTER — Ambulatory Visit: Payer: PPO | Admitting: Cardiovascular Disease

## 2021-05-17 ENCOUNTER — Encounter: Payer: Self-pay | Admitting: Cardiovascular Disease

## 2021-05-17 VITALS — BP 144/76 | HR 71 | Ht 61.0 in

## 2021-05-17 DIAGNOSIS — I480 Paroxysmal atrial fibrillation: Secondary | ICD-10-CM | POA: Diagnosis not present

## 2021-05-17 DIAGNOSIS — E785 Hyperlipidemia, unspecified: Secondary | ICD-10-CM | POA: Diagnosis not present

## 2021-05-17 DIAGNOSIS — E782 Mixed hyperlipidemia: Secondary | ICD-10-CM | POA: Diagnosis not present

## 2021-05-17 DIAGNOSIS — I10 Essential (primary) hypertension: Secondary | ICD-10-CM | POA: Diagnosis not present

## 2021-05-17 NOTE — Patient Instructions (Signed)

## 2021-05-17 NOTE — Assessment & Plan Note (Signed)
History of essential hypertension a blood pressure measured today at 144/76.  She is on diltiazem and lisinopril. ?

## 2021-05-17 NOTE — Assessment & Plan Note (Signed)
History of PAF maintaining sinus rhythm on Eliquis oral anticoagulation. The CHA2DSVASC2 score is 6  .  It should be noted that her presentation for A-fib with a stroke. ?

## 2021-05-17 NOTE — Progress Notes (Signed)
? ? ? ?05/17/2021 ?Sophia Frederick   ?07-01-1945  ?[id number] ? ?Primary Physician Carolee Rota, NP ?Primary Cardiologist: Lorretta Harp MD Lupe Carney, [place] ? ?HPI:  Sophia Frederick is a 76 y.o.  mild to moderately overweight married Caucasian female mother of 2, grandmother 1 grandchild referred by Carolee Rota, NP from [place] at [facility] for evaluation and treatment of recent PAF and stroke.  I last saw her in the office 08/14/2020.  Her son-in-law is a primary care physician in [place].  She does have a history of treated hypertension and hyperlipidemia.  There is no family history for heart disease.  She is never had a heart attack but did have a stroke back in March.  She was hospitalized at [facility] in [place] where she was found to be in A. fib with RVR, converted to sinus rhythm.  Work-up was unrevealing when 2D echo and CTA of her neck.  She was begun on Eliquis oral anticoagulation.  She does have shortness of breath probably related to a reactive airways disease but denies chest pain. The CHA2DSVASC2 score is 6  . ? ?Since I saw her a year ago she continues to do well.  She has no complaints.  She was complaining of some back pain which is somewhat exertional but this resolved this past fall after getting the flu.  A 2D echo was obtained 12/18/2020 which was essentially normal as well as a Myoview stress test. ? ? ?Current Meds  ?Medication Sig  ? albuterol (VENTOLIN HFA) 108 (90 Base) MCG/ACT inhaler Inhale into the lungs every 6 (six) hours as needed for wheezing or shortness of breath.  ? apixaban (ELIQUIS) 5 MG TABS tablet Take 5 mg by mouth 2 (two) times daily.  ? cholecalciferol (VITAMIN D3) 25 MCG (1000 UNIT) tablet Take 1,000 Units by mouth daily.  ? co-enzyme Q-10 30 MG capsule Take 200 mg by mouth daily.  ? diltiazem (DILACOR XR) 120 MG 24 hr capsule Take 1 capsule (120 mg total) by mouth daily.  ? esomeprazole (NEXIUM) 20 MG capsule Take 20 mg by mouth daily at 12 noon.  ?  fluticasone-salmeterol (ADVAIR DISKUS) 500-50 MCG/ACT AEPB Inhale 1 puff into the lungs in the morning and at bedtime.  ? Krill Oil (OMEGA-3) 500 MG CAPS Take by mouth.  ? lisinopril (ZESTRIL) 10 MG tablet Take 10 mg by mouth daily.  ? montelukast (SINGULAIR) 10 MG tablet Take 1 tablet (10 mg total) by mouth at bedtime.  ? Multiple Vitamins-Minerals (MULTIVITAMIN WITH MINERALS) tablet Take 1 tablet by mouth daily.  ? rosuvastatin (CRESTOR) 20 MG tablet Take 20 mg by mouth at bedtime.  ?  ? ?Allergies  ?Allergen Reactions  ? Levaquin [Levofloxacin In D5w]   ? ? ?Social History  ? ?Socioeconomic History  ? Marital status: Married  ?  Spouse name: Shanon Brow  ? Number of children: Not on file  ? Years of education: Not on file  ? Highest education level: Not on file  ?Occupational History  ? Not on file  ?Tobacco Use  ? Smoking status: Never  ? Smokeless tobacco: Never  ?Substance and Sexual Activity  ? Alcohol use: No  ? Drug use: No  ? Sexual activity: Not on file  ?Other Topics Concern  ? Not on file  ?Social History Narrative  ? Lives at home with Shanon Brow husband  ? Right Handed  ? Drinks caffeine seldomly  ? ?Social Determinants of Health  ? ?Financial Resource Strain: Not  on file  ?Food Insecurity: Not on file  ?Transportation Needs: Not on file  ?Physical Activity: Not on file  ?Stress: Not on file  ?Social Connections: Not on file  ?Intimate Partner Violence: Not on file  ?  ? ?Review of Systems: ?General: negative for chills, fever, night sweats or weight changes.  ?Cardiovascular: negative for chest pain, dyspnea on exertion, edema, orthopnea, palpitations, paroxysmal nocturnal dyspnea or shortness of breath ?Dermatological: negative for rash ?Respiratory: negative for cough or wheezing ?Urologic: negative for hematuria ?Abdominal: negative for nausea, vomiting, diarrhea, bright red blood per rectum, melena, or hematemesis ?Neurologic: negative for visual changes, syncope, or dizziness ?All other systems reviewed  and are otherwise negative except as noted above. ? ? ? ?Blood pressure (!) 144/76, pulse 71, height 5\' 1"  (1.549 m).  ?General appearance: alert and no distress ?Neck: no adenopathy, no carotid bruit, no JVD, supple, symmetrical, trachea midline, and thyroid not enlarged, symmetric, no tenderness/mass/nodules ?Lungs: clear to auscultation bilaterally ?Heart: regular rate and rhythm, S1, S2 normal, no murmur, click, rub or gallop ?Extremities: extremities normal, atraumatic, no cyanosis or edema ?Pulses: 2+ and symmetric ?Skin: Skin color, texture, turgor normal. No rashes or lesions ?Neurologic: Grossly normal ? ?EKG sinus rhythm at 71 with frequent PACs and poor R wave progression.  I personally reviewed this EKG. ? ?ASSESSMENT AND PLAN:  ? ?Essential hypertension ?History of essential hypertension a blood pressure measured today at 144/76.  She is on diltiazem and lisinopril. ? ?Hyperlipidemia ?History of hyperlipidemia on statin therapy with lipid profile performed 10/25/2020 revealing total cholesterol 159, LDL 79 and HDL 42. ? ?PAF (paroxysmal atrial fibrillation) ([place]) ?History of PAF maintaining sinus rhythm on Eliquis oral anticoagulation. The CHA2DSVASC2 score is 6  .  It should be noted that her presentation for A-fib with a stroke. ? ? ? ? ?Lorretta Harp MD FACP,FACC,FAHA, FSCAI ?05/17/2021 ?10:24 AM ?

## 2021-05-17 NOTE — Assessment & Plan Note (Signed)
History of hyperlipidemia on statin therapy with lipid profile performed 10/25/2020 revealing total cholesterol 159, LDL 79 and HDL 42. ?

## 2021-06-24 DIAGNOSIS — M21931 Unspecified acquired deformity of right forearm: Secondary | ICD-10-CM | POA: Diagnosis not present

## 2021-12-11 DIAGNOSIS — H43391 Other vitreous opacities, right eye: Secondary | ICD-10-CM | POA: Diagnosis not present

## 2021-12-11 DIAGNOSIS — H5203 Hypermetropia, bilateral: Secondary | ICD-10-CM | POA: Diagnosis not present

## 2022-01-02 DIAGNOSIS — I10 Essential (primary) hypertension: Secondary | ICD-10-CM | POA: Diagnosis not present

## 2022-01-02 DIAGNOSIS — Z23 Encounter for immunization: Secondary | ICD-10-CM | POA: Diagnosis not present

## 2022-01-02 DIAGNOSIS — J45909 Unspecified asthma, uncomplicated: Secondary | ICD-10-CM | POA: Diagnosis not present

## 2022-01-02 DIAGNOSIS — Z1239 Encounter for other screening for malignant neoplasm of breast: Secondary | ICD-10-CM | POA: Diagnosis not present

## 2022-01-02 DIAGNOSIS — Z0000 Encounter for general adult medical examination without abnormal findings: Secondary | ICD-10-CM | POA: Diagnosis not present

## 2022-01-02 DIAGNOSIS — M8588 Other specified disorders of bone density and structure, other site: Secondary | ICD-10-CM | POA: Diagnosis not present

## 2022-01-02 DIAGNOSIS — I4891 Unspecified atrial fibrillation: Secondary | ICD-10-CM | POA: Diagnosis not present

## 2022-01-02 DIAGNOSIS — E782 Mixed hyperlipidemia: Secondary | ICD-10-CM | POA: Diagnosis not present

## 2022-01-03 ENCOUNTER — Other Ambulatory Visit: Payer: Self-pay | Admitting: Family Medicine

## 2022-01-03 DIAGNOSIS — Z1231 Encounter for screening mammogram for malignant neoplasm of breast: Secondary | ICD-10-CM

## 2022-01-03 DIAGNOSIS — Z780 Asymptomatic menopausal state: Secondary | ICD-10-CM

## 2022-01-03 DIAGNOSIS — M810 Age-related osteoporosis without current pathological fracture: Secondary | ICD-10-CM

## 2022-01-22 ENCOUNTER — Ambulatory Visit (INDEPENDENT_AMBULATORY_CARE_PROVIDER_SITE_OTHER): Payer: PPO

## 2022-01-22 DIAGNOSIS — Z1231 Encounter for screening mammogram for malignant neoplasm of breast: Secondary | ICD-10-CM

## 2022-01-22 DIAGNOSIS — M810 Age-related osteoporosis without current pathological fracture: Secondary | ICD-10-CM | POA: Diagnosis not present

## 2022-01-22 DIAGNOSIS — Z780 Asymptomatic menopausal state: Secondary | ICD-10-CM | POA: Diagnosis not present

## 2022-01-22 DIAGNOSIS — M8589 Other specified disorders of bone density and structure, multiple sites: Secondary | ICD-10-CM | POA: Diagnosis not present

## 2022-01-24 ENCOUNTER — Other Ambulatory Visit: Payer: Self-pay | Admitting: Family Medicine

## 2022-01-24 DIAGNOSIS — R928 Other abnormal and inconclusive findings on diagnostic imaging of breast: Secondary | ICD-10-CM

## 2022-02-04 ENCOUNTER — Ambulatory Visit
Admission: RE | Admit: 2022-02-04 | Discharge: 2022-02-04 | Disposition: A | Discharge status: Home / Self Care | Payer: PPO | Source: Ambulatory Visit | Attending: Family Medicine | Admitting: Family Medicine

## 2022-02-04 DIAGNOSIS — R928 Other abnormal and inconclusive findings on diagnostic imaging of breast: Secondary | ICD-10-CM | POA: Diagnosis not present

## 2022-02-04 DIAGNOSIS — E782 Mixed hyperlipidemia: Secondary | ICD-10-CM | POA: Diagnosis not present

## 2022-02-04 DIAGNOSIS — N289 Disorder of kidney and ureter, unspecified: Secondary | ICD-10-CM | POA: Diagnosis not present

## 2022-02-21 ENCOUNTER — Telehealth: Payer: Self-pay | Admitting: *Deleted

## 2022-02-21 NOTE — Patient Outreach (Signed)
  Care Coordination   02/21/2022 Name: Sophia Frederick MRN: [id number] DOB: 08-03-1945   Care Coordination Outreach Attempts:  An unsuccessful telephone outreach was attempted today to offer the patient information about available care coordination services as a benefit of their health plan.   Follow Up Plan:  Additional outreach attempts will be made to offer the patient care coordination information and services.   Encounter Outcome:  No Answer   Care Coordination Interventions:  No, not indicated    Raina Mina, RN Care Management Coordinator [place] Office [phone]

## 2022-02-27 DIAGNOSIS — H18413 Arcus senilis, bilateral: Secondary | ICD-10-CM | POA: Diagnosis not present

## 2022-02-27 DIAGNOSIS — Z961 Presence of intraocular lens: Secondary | ICD-10-CM | POA: Diagnosis not present

## 2022-02-27 DIAGNOSIS — H40013 Open angle with borderline findings, low risk, bilateral: Secondary | ICD-10-CM | POA: Diagnosis not present

## 2022-02-27 DIAGNOSIS — H26491 Other secondary cataract, right eye: Secondary | ICD-10-CM | POA: Diagnosis not present

## 2022-02-27 DIAGNOSIS — H26493 Other secondary cataract, bilateral: Secondary | ICD-10-CM | POA: Diagnosis not present

## 2022-03-06 DIAGNOSIS — H43391 Other vitreous opacities, right eye: Secondary | ICD-10-CM | POA: Diagnosis not present

## 2022-03-17 DIAGNOSIS — R0602 Shortness of breath: Secondary | ICD-10-CM | POA: Diagnosis not present

## 2022-03-17 DIAGNOSIS — J984 Other disorders of lung: Secondary | ICD-10-CM | POA: Diagnosis not present

## 2022-03-17 DIAGNOSIS — Z881 Allergy status to other antibiotic agents status: Secondary | ICD-10-CM | POA: Diagnosis not present

## 2022-03-17 DIAGNOSIS — R0789 Other chest pain: Secondary | ICD-10-CM | POA: Diagnosis not present

## 2022-03-17 DIAGNOSIS — R911 Solitary pulmonary nodule: Secondary | ICD-10-CM | POA: Diagnosis not present

## 2022-03-17 DIAGNOSIS — R9431 Abnormal electrocardiogram [ECG] [EKG]: Secondary | ICD-10-CM | POA: Diagnosis not present

## 2022-03-17 DIAGNOSIS — Z136 Encounter for screening for cardiovascular disorders: Secondary | ICD-10-CM | POA: Diagnosis not present

## 2022-03-17 DIAGNOSIS — I4891 Unspecified atrial fibrillation: Secondary | ICD-10-CM | POA: Diagnosis not present

## 2022-03-17 DIAGNOSIS — R918 Other nonspecific abnormal finding of lung field: Secondary | ICD-10-CM | POA: Diagnosis not present

## 2022-03-17 DIAGNOSIS — R079 Chest pain, unspecified: Secondary | ICD-10-CM | POA: Diagnosis not present

## 2022-03-17 DIAGNOSIS — Z7901 Long term (current) use of anticoagulants: Secondary | ICD-10-CM | POA: Diagnosis not present

## 2022-03-17 DIAGNOSIS — I517 Cardiomegaly: Secondary | ICD-10-CM | POA: Diagnosis not present

## 2022-03-17 DIAGNOSIS — Z79899 Other long term (current) drug therapy: Secondary | ICD-10-CM | POA: Diagnosis not present

## 2022-03-24 DIAGNOSIS — I10 Essential (primary) hypertension: Secondary | ICD-10-CM | POA: Diagnosis not present

## 2022-03-24 DIAGNOSIS — E785 Hyperlipidemia, unspecified: Secondary | ICD-10-CM | POA: Diagnosis not present

## 2022-03-24 DIAGNOSIS — I4891 Unspecified atrial fibrillation: Secondary | ICD-10-CM | POA: Diagnosis not present

## 2022-03-27 DIAGNOSIS — H26492 Other secondary cataract, left eye: Secondary | ICD-10-CM | POA: Diagnosis not present

## 2022-03-27 DIAGNOSIS — H26493 Other secondary cataract, bilateral: Secondary | ICD-10-CM | POA: Diagnosis not present

## 2022-04-04 DIAGNOSIS — H43391 Other vitreous opacities, right eye: Secondary | ICD-10-CM | POA: Diagnosis not present

## 2022-07-03 DIAGNOSIS — I10 Essential (primary) hypertension: Secondary | ICD-10-CM | POA: Diagnosis not present

## 2022-08-12 DIAGNOSIS — I10 Essential (primary) hypertension: Secondary | ICD-10-CM | POA: Diagnosis not present

## 2023-01-07 DIAGNOSIS — I10 Essential (primary) hypertension: Secondary | ICD-10-CM | POA: Diagnosis not present

## 2023-01-07 DIAGNOSIS — I4891 Unspecified atrial fibrillation: Secondary | ICD-10-CM | POA: Diagnosis not present

## 2023-01-09 DIAGNOSIS — Z23 Encounter for immunization: Secondary | ICD-10-CM | POA: Diagnosis not present

## 2023-01-09 DIAGNOSIS — I10 Essential (primary) hypertension: Secondary | ICD-10-CM | POA: Diagnosis not present

## 2023-01-09 DIAGNOSIS — N1831 Chronic kidney disease, stage 3a: Secondary | ICD-10-CM | POA: Diagnosis not present

## 2023-01-09 DIAGNOSIS — I700 Atherosclerosis of aorta: Secondary | ICD-10-CM | POA: Diagnosis not present

## 2023-01-09 DIAGNOSIS — E782 Mixed hyperlipidemia: Secondary | ICD-10-CM | POA: Diagnosis not present

## 2023-01-09 DIAGNOSIS — Z1239 Encounter for other screening for malignant neoplasm of breast: Secondary | ICD-10-CM | POA: Diagnosis not present

## 2023-01-09 DIAGNOSIS — M8588 Other specified disorders of bone density and structure, other site: Secondary | ICD-10-CM | POA: Diagnosis not present

## 2023-01-09 DIAGNOSIS — Z0000 Encounter for general adult medical examination without abnormal findings: Secondary | ICD-10-CM | POA: Diagnosis not present

## 2023-01-09 DIAGNOSIS — J45909 Unspecified asthma, uncomplicated: Secondary | ICD-10-CM | POA: Diagnosis not present

## 2023-01-09 DIAGNOSIS — M5431 Sciatica, right side: Secondary | ICD-10-CM | POA: Diagnosis not present

## 2023-01-09 DIAGNOSIS — I4891 Unspecified atrial fibrillation: Secondary | ICD-10-CM | POA: Diagnosis not present

## 2023-03-02 IMAGING — CT CT ANGIO CHEST-ABD-PELV FOR DISSECTION W/ AND WO/W CM
2 of 7 series · 14 of 46 positions shown, 16 images · non-contrast
Comparison: None.

CLINICAL DATA: Acute midthoracic back pain between the shoulder
blades

EXAM:
CT ANGIOGRAPHY CHEST, ABDOMEN AND PELVIS
TECHNIQUE: Non-contrast CT of the chest was initially obtained.

[Series 5: dissection 3.0 i30f 3 · axial · 0.71mm/px · z∈[+1043,+1535]mm · 11 of 188 slices shown, 13 images]
[im 12/188  soft-tissue]
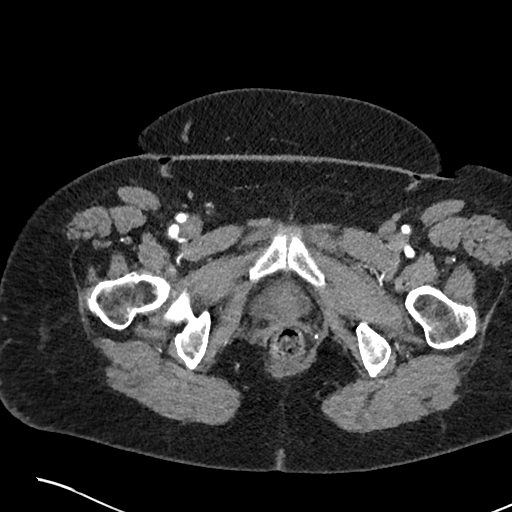
[im 12/188  bone]
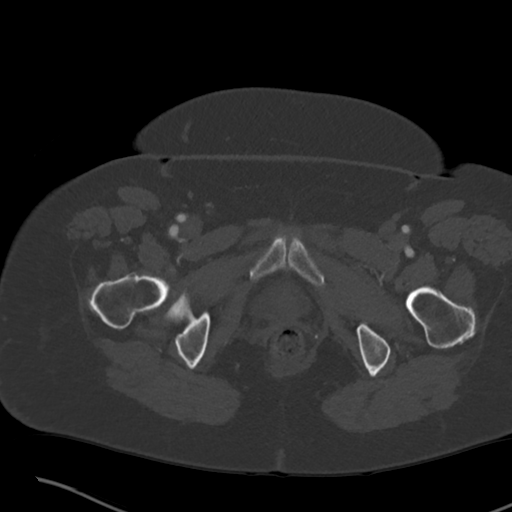
[im 36/188  soft-tissue]
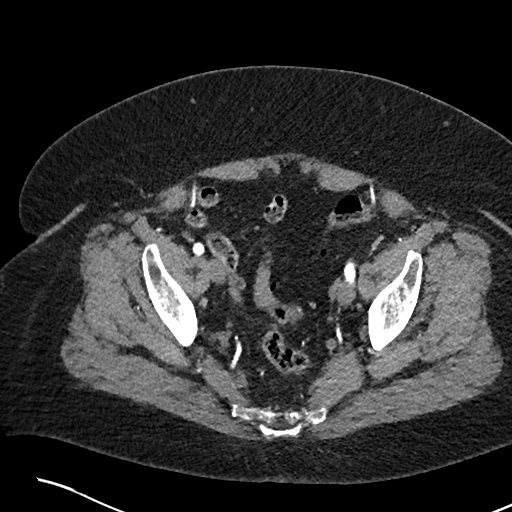
[im 47/188  soft-tissue]
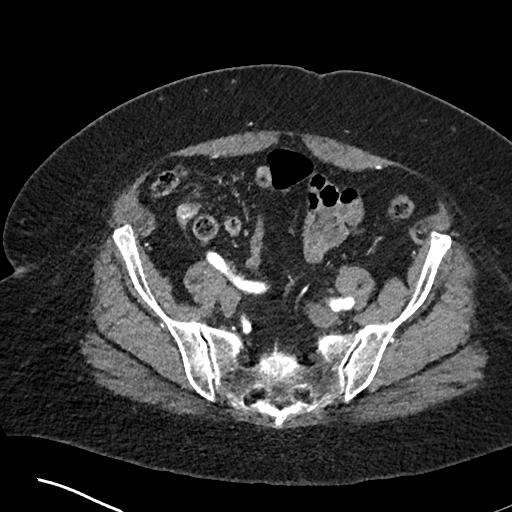
[im 59/188  soft-tissue]
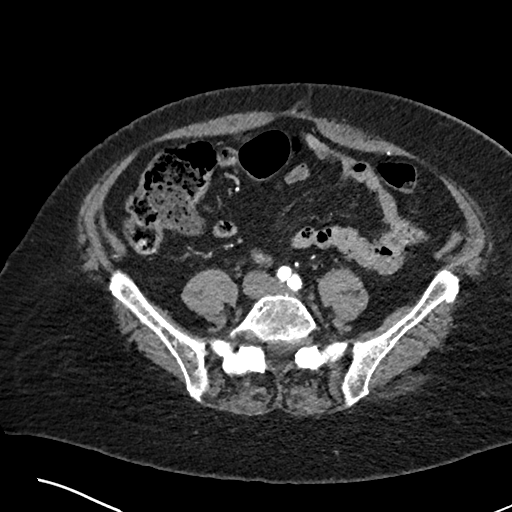
[im 82/188  soft-tissue]
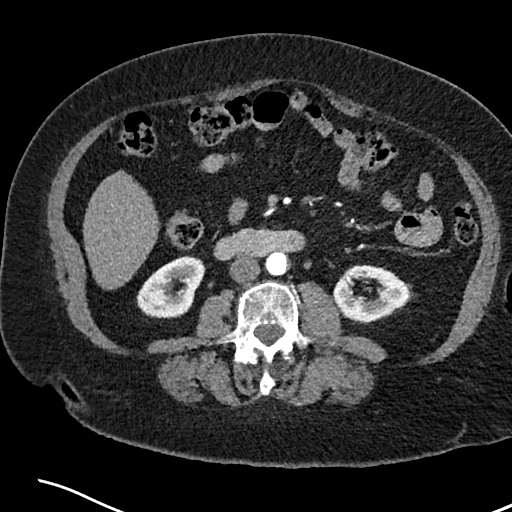
[im 94/188  soft-tissue]
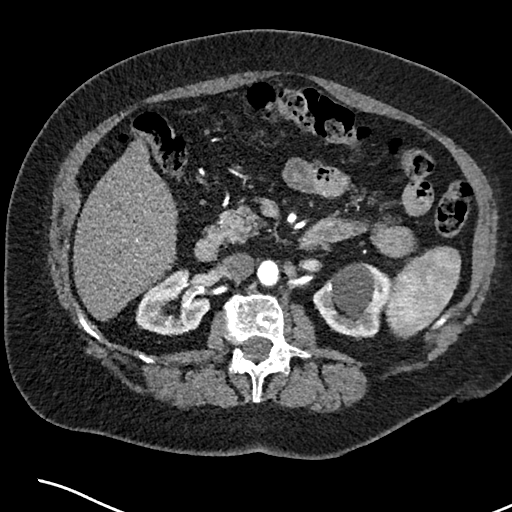
[im 106/188  soft-tissue]
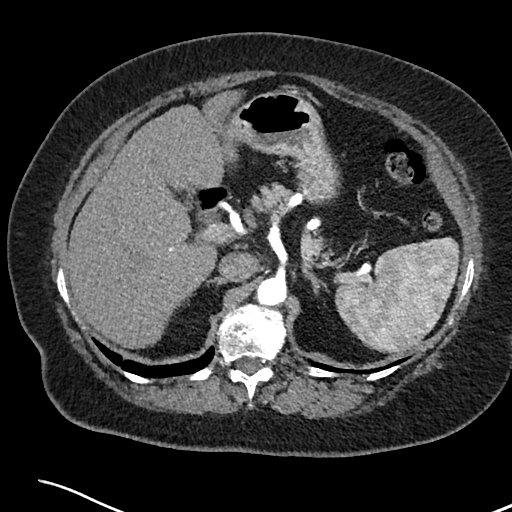
[im 129/188  soft-tissue]
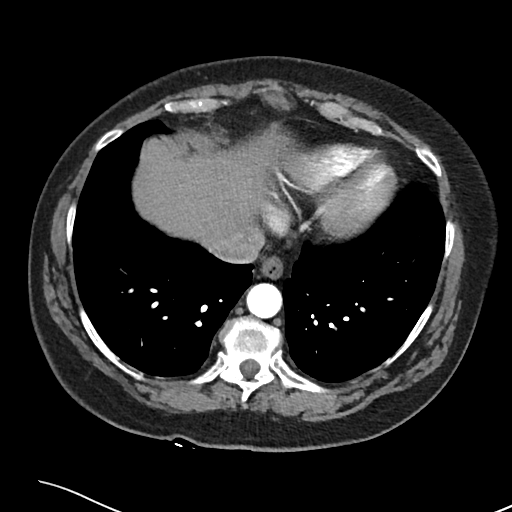
[im 141/188  soft-tissue]
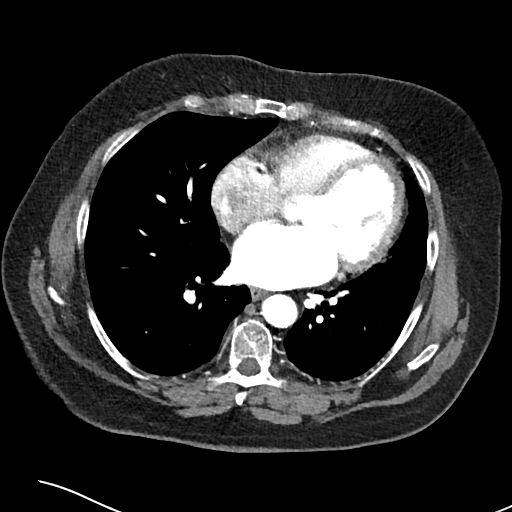
[im 141/188  bone]
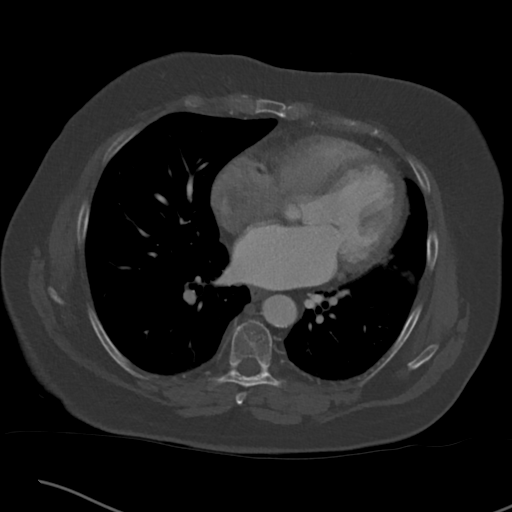
[im 152/188  soft-tissue]
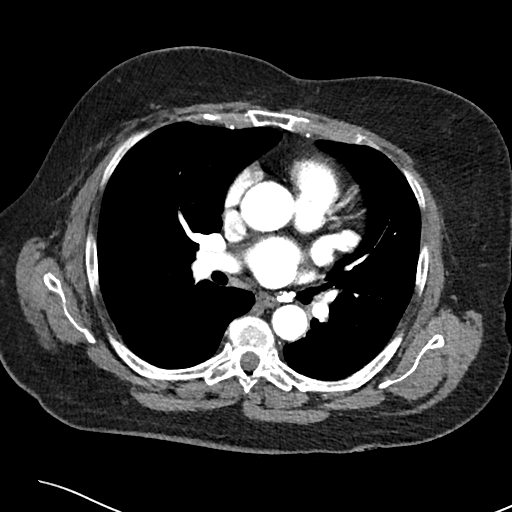
[im 176/188  soft-tissue]
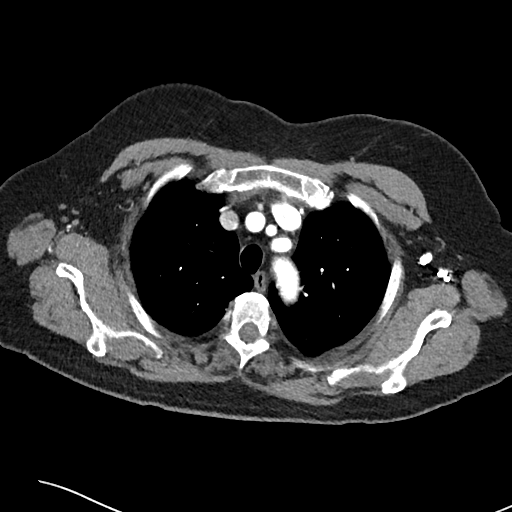

[Series 9: coronals · coronal · 0.82mm/px · 3 of 144 slices shown]
[im 36/144  soft-tissue]
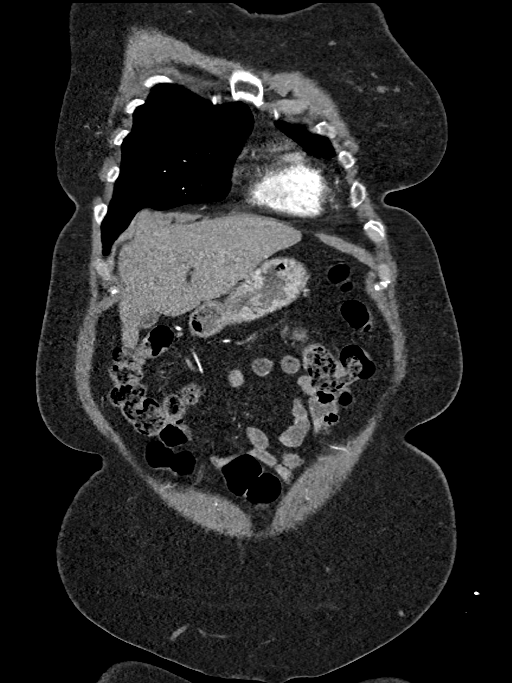
[im 72/144  soft-tissue]
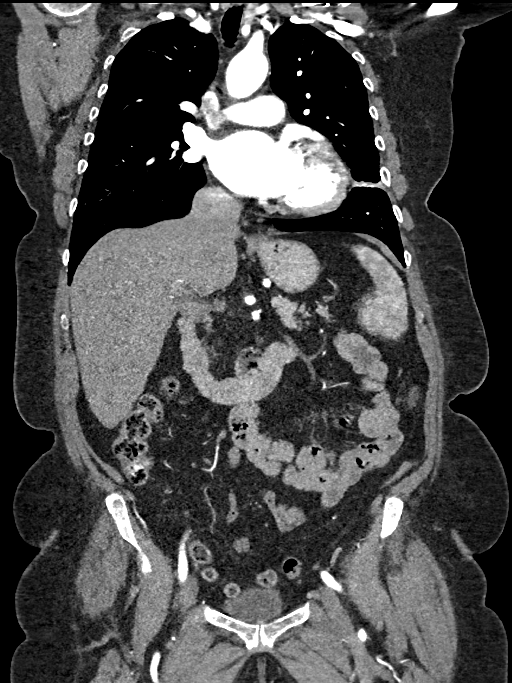
[im 108/144  soft-tissue]
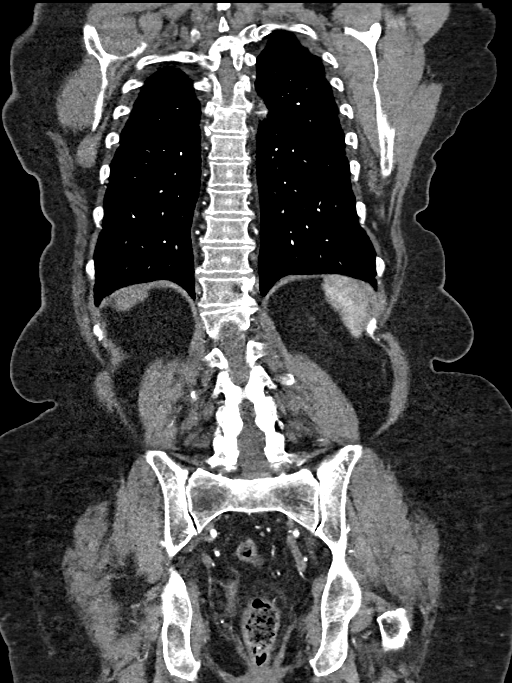

[14 of 46 positions shown; findings below may reference images not displayed]

Multidetector CT imaging through the chest, abdomen and pelvis was
performed using the standard protocol during bolus administration of
intravenous contrast. Multiplanar reconstructed images and MIPs were
obtained and reviewed to evaluate the vascular anatomy.

CONTRAST:  80mL OMNIPAQUE IOHEXOL 350 MG/ML SOLN
FINDINGS: CTA CHEST FINDINGS

Cardiovascular: Intact thoracic aorta. Negative for aneurysm or
dissection. No acute aortic finding. Patent 3 vessel arch anatomy.

Central pulmonary arteries are patent. No significant filling defect
or pulmonary embolus.

No mediastinal hemorrhage or hematoma. Normal heart size. No
pericardial effusion. Native coronary atherosclerosis.

Central venous structures are patent.  No Adi Adrian process.

Mediastinum/Nodes: Inferior isthmus thyroid nodularity measuring
cm. Consider follow-up thyroid ultrasound. No adenopathy. Calcified
mediastinal and hilar lymph nodes from remote granulomatous disease.
Trachea and central airways are patent. Esophagus nondilated. No
hiatal hernia.

Lungs/Pleura: Lungs are clear. No pleural effusion or pneumothorax.

Musculoskeletal: No chest wall abnormality. No acute or significant
osseous findings.

Review of the MIP images confirms the above findings.

CTA ABDOMEN AND PELVIS FINDINGS

VASCULAR

Aorta: Abdominal atherosclerosis noted without aneurysm, dissection,
or acute vascular finding. No occlusive disease.

Celiac: Patent without evidence of aneurysm, dissection, vasculitis
or significant stenosis.

SMA: Widely patent origin including its branches

Renals: Main renal arteries are patent. No renal vascular disease or
occlusion.

IMA: Remains patent off the distal aorta including its branches

Inflow: Minor atherosclerosis iliac tortuosity. No inflow disease or
occlusion. Common, internal and external iliac arteries all remain
patent.

Veins: Dedicated venous phase imaging not performed

Review of the MIP images confirms the above findings.

NON-VASCULAR

Hepatobiliary: No focal liver abnormality is seen. No gallstones,
gallbladder wall thickening, or biliary dilatation.

Pancreas: Unremarkable. No pancreatic ductal dilatation or
surrounding inflammatory changes.

Spleen: Normal in size without focal abnormality.

Adrenals/Urinary Tract: Normal adrenal glands. Left renal para
pelvic cyst measures 3.2 cm. No renal obstruction or hydronephrosis.
No hydroureter. Urinary bladder unremarkable.

Stomach/Bowel: Negative for bowel obstruction, significant
dilatation, ileus, or free air. Duodenal diverticulum noted.
Appendix not visualized. No acute inflammatory process. No free
fluid, fluid collection, hemorrhage, hematoma, abscess or ascites.

Lymphatic: No adenopathy.

Reproductive: Status post hysterectomy. No adnexal masses.

Other: No abdominal wall hernia or abnormality. No abdominopelvic
ascites.

Musculoskeletal: Degenerative changes noted spine. No acute osseous
finding.

Review of the MIP images confirms the above findings.
IMPRESSION: Intact thoracoabdominal aorta. Atherosclerotic changes noted.
Negative for aneurysm or dissection.

No other acute intrathoracic, abdominal or pelvic vascular or
nonvascular finding.

Inferior isthmus 2 cm hypodense thyroid nodule.

Recommend thyroid US (ref: [HOSPITAL]. [DATE]):

Aortic Atherosclerosis (CIFD2-OD0.0).

## 2023-03-11 IMAGING — US US THYROID
1 series · 12 of 25 positions shown · non-contrast
Comparison: None.

CLINICAL DATA: Incidental on CT.

EXAM:
THYROID ULTRASOUND
TECHNIQUE: Ultrasound examination of the thyroid gland and adjacent soft
tissues was performed.

[Series 1: us thyroid · 0.08mm/px · 12 of 65 slices shown]
[im 3/65]
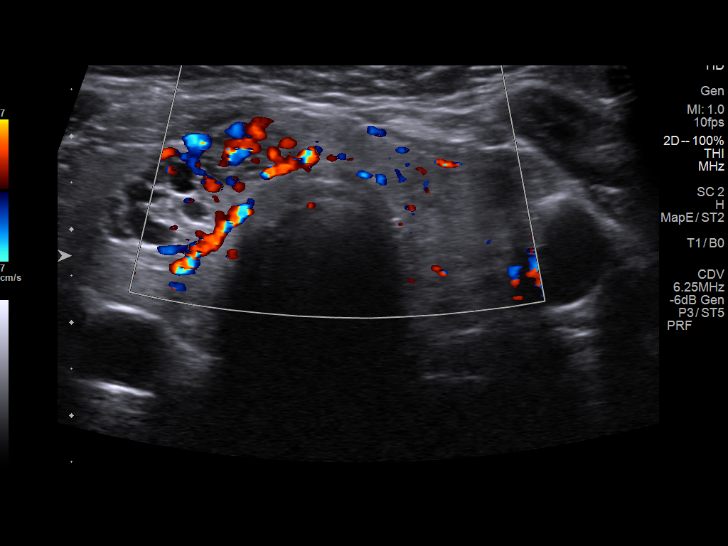
[im 9/65]
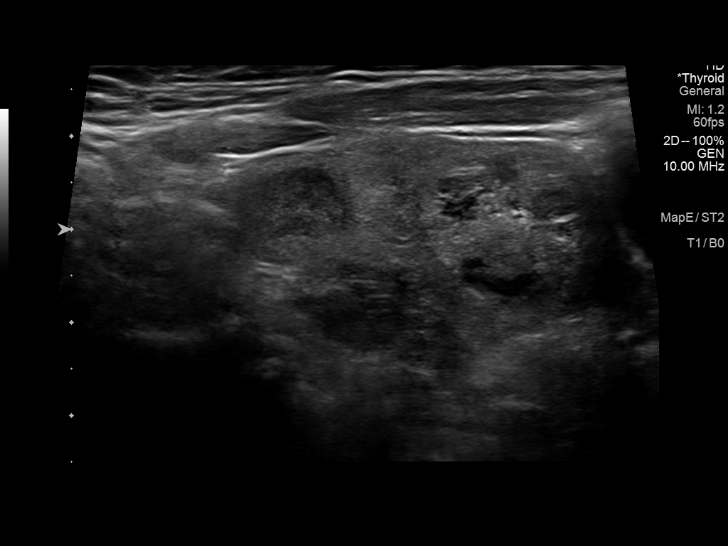
[im 14/65]
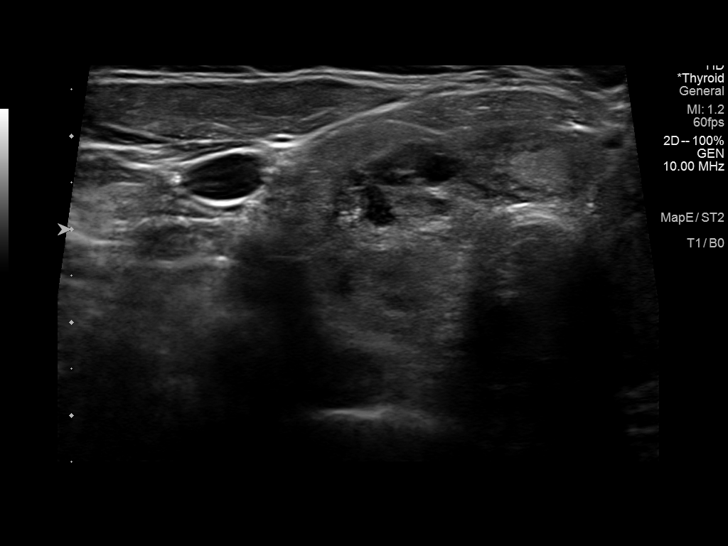
[im 19/65]
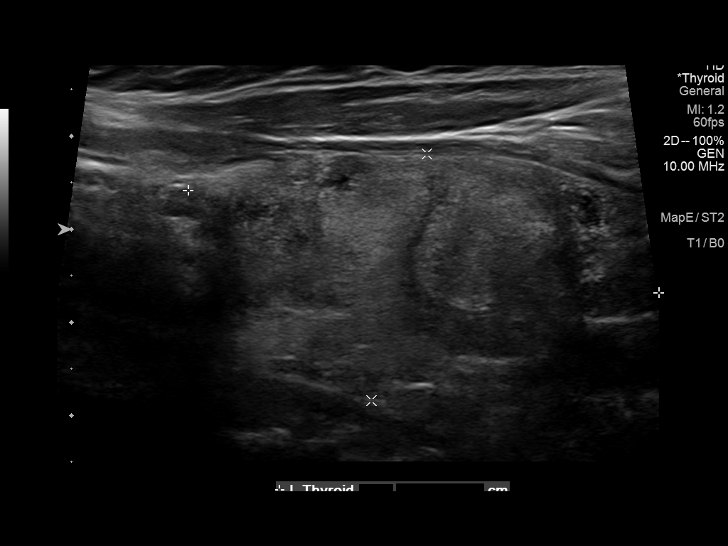
[im 25/65]
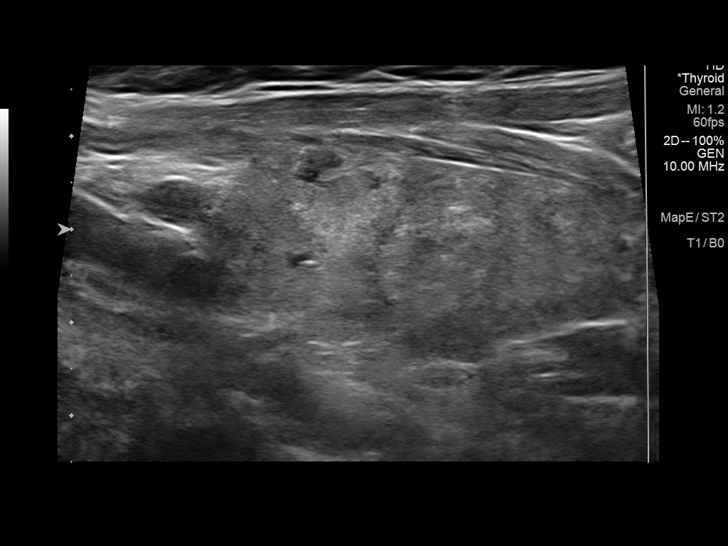
[im 30/65]
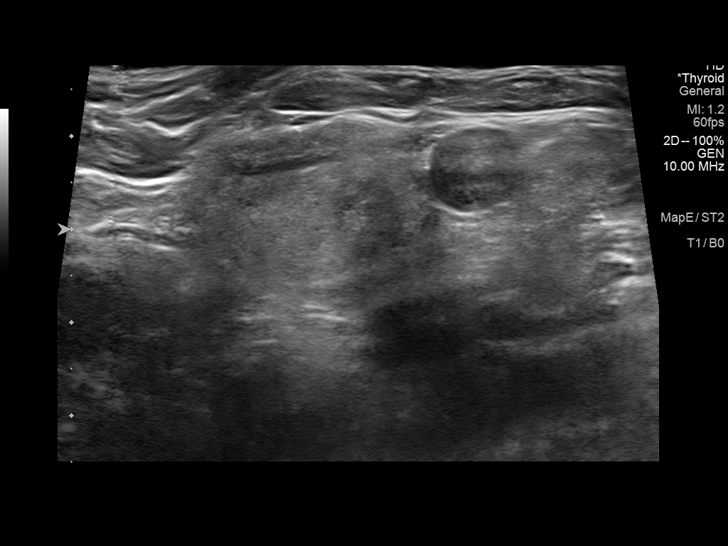
[im 35/65]
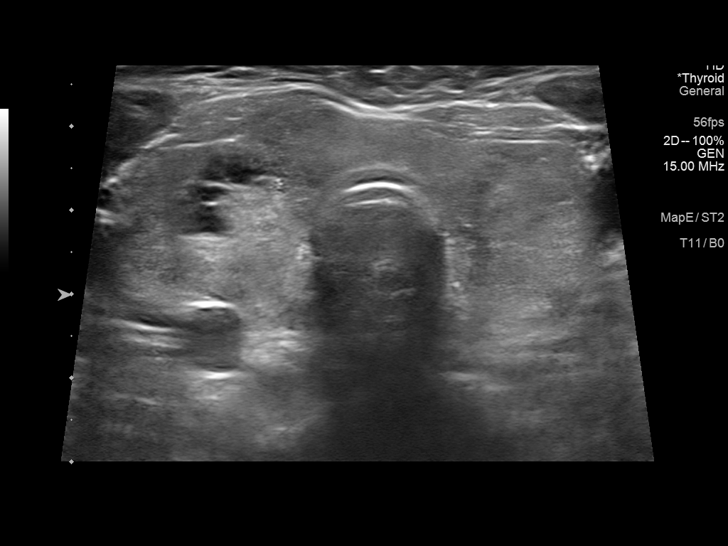
[im 41/65]
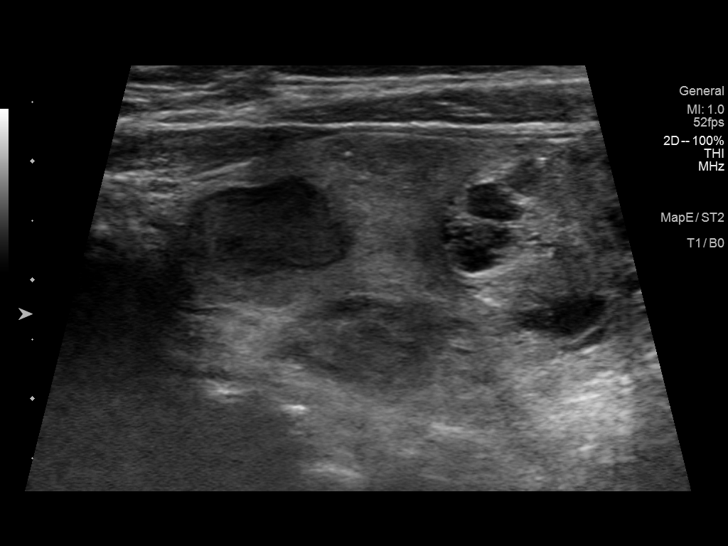
[im 46/65]
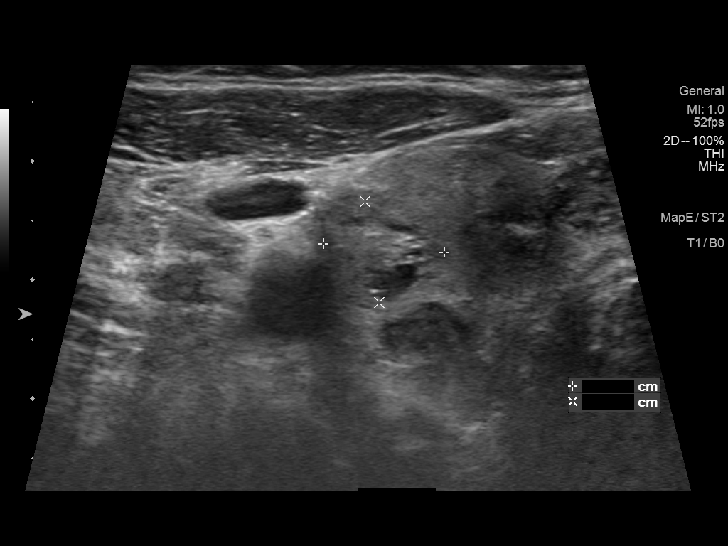
[im 51/65]
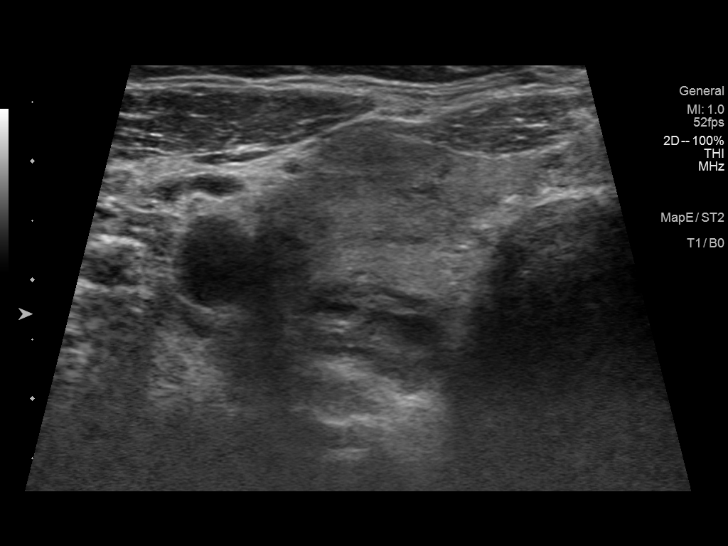
[im 57/65]
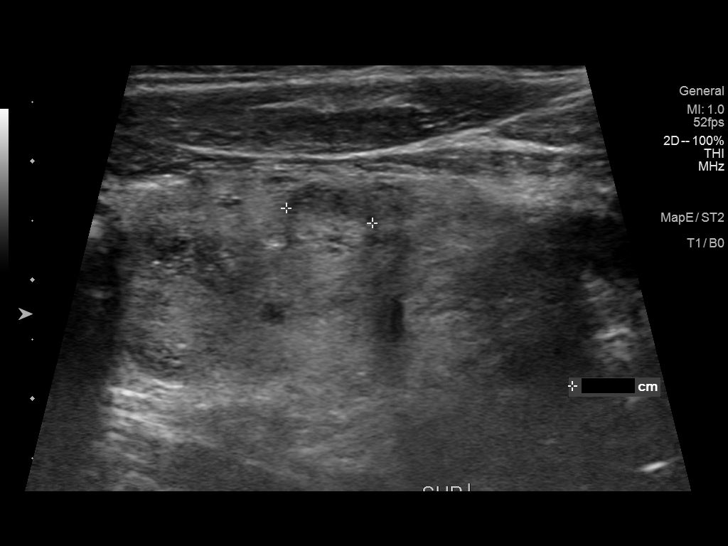
[im 62/65]
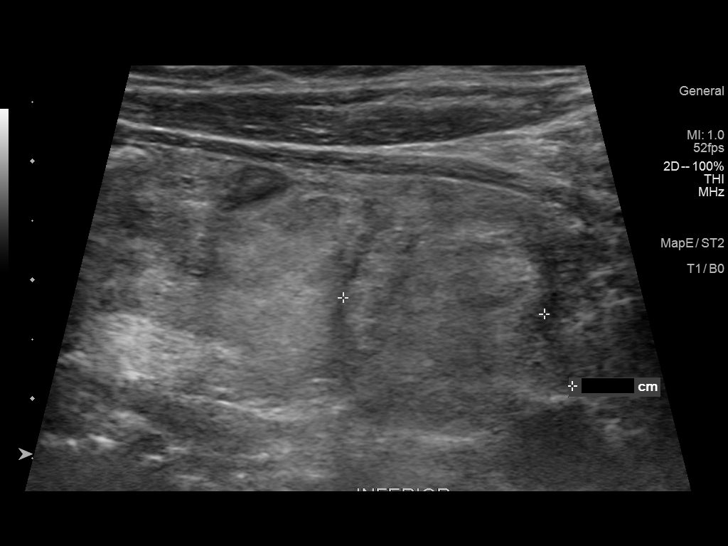

[12 of 25 positions shown; findings below may reference images not displayed]

FINDINGS: Parenchymal Echotexture: Moderately heterogenous

Isthmus: 0.8 cm

Right lobe: 4.9 x 2.4 x 2.2 cm

Left lobe: 5.2 x 2.7 x 1.7 cm

_________________________________________________________

Estimated total number of nodules >/= 1 cm: 6

Number of spongiform nodules >/=  2 cm not described below (TR1): 0

Number of mixed cystic and solid nodules >/= 1.5 cm not described
below (TR2): 0

_________________________________________________________

Nodule # 1:

Location: Isthmus;

Maximum size: 1.8 cm; Other 2 dimensions: 1.5 x 0.9 cm

Composition: solid/almost completely solid (2)

Echogenicity: isoechoic (1)

Shape: not taller-than-wide (0)

Margins: smooth (0)

Echogenic foci: none (0)

ACR TI-RADS total points: 3.

ACR TI-RADS risk category: TR3 (3 points).

ACR TI-RADS recommendations:

*Given size (>/= 1.5 - 2.4 cm) and appearance, a follow-up
ultrasound in 1 year should be considered based on TI-RADS criteria.

_________________________________________________________

Nodule # 2:

Location: Right; Superior

Maximum size: 1.3 cm; Other 2 dimensions: 1.2 x 0.8 cm

Composition: solid/almost completely solid (2)

Echogenicity: very hypoechoic (3)

Shape: not taller-than-wide (0)

Margins: smooth (0)

Echogenic foci: none (0)

ACR TI-RADS total points: 5.

ACR TI-RADS risk category: TR4 (4-6 points).

ACR TI-RADS recommendations:

*Given size (>/= 1 - 1.4 cm) and appearance, a follow-up ultrasound
in 1 year should be considered based on TI-RADS criteria.

_________________________________________________________

Nodule # 3:

Location: Right; Mid

Maximum size: 1.2 cm; Other 2 dimensions: 1.0 x 0.9 cm

Composition: solid/almost completely solid (2)

Echogenicity: isoechoic (1)

Shape: not taller-than-wide (0)

Margins: smooth (0)

Echogenic foci: none (0)

ACR TI-RADS total points: 3.

ACR TI-RADS risk category: TR3 (3 points).

ACR TI-RADS recommendations:

Given size (<1.4 cm) and appearance, this nodule does NOT meet
TI-RADS criteria for biopsy or dedicated follow-up.

_________________________________________________________

Nodule # 4:

Location: Right; Mid

Maximum size: 1.3 cm; Other 2 dimensions: 1.3 x 0.8 cm

Composition: solid/almost completely solid (2)

Echogenicity: hypoechoic (2)

Shape: not taller-than-wide (0)

Margins: smooth (0)

Echogenic foci: none (0)

ACR TI-RADS total points: 4.

ACR TI-RADS risk category: TR4 (4-6 points).

ACR TI-RADS recommendations:

*Given size (>/= 1 - 1.4 cm) and appearance, a follow-up ultrasound
in 1 year should be considered based on TI-RADS criteria.

_________________________________________________________

Nodule # 5:

Location: Right; Inferior

Maximum size: 2.0 cm; Other 2 dimensions: 2.0 x 1.6 cm

Composition: solid/almost completely solid (2)

Echogenicity: isoechoic (1)

Shape: not taller-than-wide (0)

Margins: smooth (0)

Echogenic foci: none (0)

ACR TI-RADS total points: 3.

ACR TI-RADS risk category: TR3 (3 points).

ACR TI-RADS recommendations:

*Given size (>/= 1.5 - 2.4 cm) and appearance, a follow-up
ultrasound in 1 year should be considered based on TI-RADS criteria.

_________________________________________________________

Nodule 6 refers to a subcentimeter solid isoechoic (TR 3) nodule in
the left thyroid lobe, which does not meet criteria for further
follow-up or biopsy.

_________________________________________________________

Nodule # 7:

Location: Left; Inferior

Maximum size: 1.7 cm; Other 2 dimensions: 1.5 x 1.5 cm

Composition: solid/almost completely solid (2)

Echogenicity: isoechoic (1)

Shape: not taller-than-wide (0)

Margins: smooth (0)

Echogenic foci: none (0)

ACR TI-RADS total points: 3.

ACR TI-RADS risk category: TR3 (3 points).

ACR TI-RADS recommendations:

*Given size (>/= 1.5 - 2.4 cm) and appearance, a follow-up
ultrasound in 1 year should be considered based on TI-RADS criteria.

_________________________________________________________
IMPRESSION: 1. Multinodular thyroid gland.
2. Nodules labeled 1, 2, 4, 5 and 7 meet criteria for follow-up
ultrasound in 1 year. No nodules meet criteria for biopsy at this
time.

The above is in keeping with the ACR TI-RADS recommendations - [HOSPITAL] 2549;[DATE].

## 2023-07-08 DIAGNOSIS — Z1330 Encounter for screening examination for mental health and behavioral disorders, unspecified: Secondary | ICD-10-CM | POA: Diagnosis not present

## 2023-07-08 DIAGNOSIS — I4891 Unspecified atrial fibrillation: Secondary | ICD-10-CM | POA: Diagnosis not present

## 2023-07-08 DIAGNOSIS — I10 Essential (primary) hypertension: Secondary | ICD-10-CM | POA: Diagnosis not present

## 2023-10-01 DIAGNOSIS — Z6836 Body mass index (BMI) 36.0-36.9, adult: Secondary | ICD-10-CM | POA: Diagnosis not present

## 2023-10-01 DIAGNOSIS — M25562 Pain in left knee: Secondary | ICD-10-CM | POA: Diagnosis not present

## 2023-10-14 DIAGNOSIS — M25562 Pain in left knee: Secondary | ICD-10-CM | POA: Diagnosis not present

## 2023-10-21 DIAGNOSIS — M25562 Pain in left knee: Secondary | ICD-10-CM | POA: Diagnosis not present

## 2023-10-26 DIAGNOSIS — M25562 Pain in left knee: Secondary | ICD-10-CM | POA: Diagnosis not present

## 2023-10-28 DIAGNOSIS — M25562 Pain in left knee: Secondary | ICD-10-CM | POA: Diagnosis not present

## 2023-11-02 DIAGNOSIS — M25562 Pain in left knee: Secondary | ICD-10-CM | POA: Diagnosis not present

## 2023-11-04 DIAGNOSIS — M25562 Pain in left knee: Secondary | ICD-10-CM | POA: Diagnosis not present

## 2023-11-09 DIAGNOSIS — M25562 Pain in left knee: Secondary | ICD-10-CM | POA: Diagnosis not present

## 2023-11-12 DIAGNOSIS — M25562 Pain in left knee: Secondary | ICD-10-CM | POA: Diagnosis not present

## 2023-11-18 DIAGNOSIS — M25562 Pain in left knee: Secondary | ICD-10-CM | POA: Diagnosis not present

## 2023-11-20 DIAGNOSIS — M25562 Pain in left knee: Secondary | ICD-10-CM | POA: Diagnosis not present

## 2023-11-23 DIAGNOSIS — M25562 Pain in left knee: Secondary | ICD-10-CM | POA: Diagnosis not present

## 2023-11-25 DIAGNOSIS — M25562 Pain in left knee: Secondary | ICD-10-CM | POA: Diagnosis not present

## 2023-11-30 DIAGNOSIS — M25562 Pain in left knee: Secondary | ICD-10-CM | POA: Diagnosis not present

## 2023-12-02 DIAGNOSIS — M25562 Pain in left knee: Secondary | ICD-10-CM | POA: Diagnosis not present

## 2023-12-07 DIAGNOSIS — M25562 Pain in left knee: Secondary | ICD-10-CM | POA: Diagnosis not present

## 2023-12-09 DIAGNOSIS — M25562 Pain in left knee: Secondary | ICD-10-CM | POA: Diagnosis not present

## 2023-12-14 DIAGNOSIS — M25562 Pain in left knee: Secondary | ICD-10-CM | POA: Diagnosis not present

## 2023-12-16 DIAGNOSIS — M25562 Pain in left knee: Secondary | ICD-10-CM | POA: Diagnosis not present

## 2023-12-21 DIAGNOSIS — M25562 Pain in left knee: Secondary | ICD-10-CM | POA: Diagnosis not present

## 2023-12-23 DIAGNOSIS — M25562 Pain in left knee: Secondary | ICD-10-CM | POA: Diagnosis not present

## 2023-12-30 DIAGNOSIS — M25562 Pain in left knee: Secondary | ICD-10-CM | POA: Diagnosis not present

## 2024-01-07 DIAGNOSIS — I4811 Longstanding persistent atrial fibrillation: Secondary | ICD-10-CM | POA: Diagnosis not present

## 2024-01-07 DIAGNOSIS — I10 Essential (primary) hypertension: Secondary | ICD-10-CM | POA: Diagnosis not present

## 2024-01-18 DIAGNOSIS — Z1331 Encounter for screening for depression: Secondary | ICD-10-CM | POA: Diagnosis not present

## 2024-01-18 DIAGNOSIS — Z6836 Body mass index (BMI) 36.0-36.9, adult: Secondary | ICD-10-CM | POA: Diagnosis not present

## 2024-01-18 DIAGNOSIS — Z23 Encounter for immunization: Secondary | ICD-10-CM | POA: Diagnosis not present

## 2024-01-18 DIAGNOSIS — M8580 Other specified disorders of bone density and structure, unspecified site: Secondary | ICD-10-CM | POA: Diagnosis not present

## 2024-01-18 DIAGNOSIS — D6869 Other thrombophilia: Secondary | ICD-10-CM | POA: Diagnosis not present

## 2024-01-18 DIAGNOSIS — N1831 Chronic kidney disease, stage 3a: Secondary | ICD-10-CM | POA: Diagnosis not present

## 2024-01-18 DIAGNOSIS — Z0000 Encounter for general adult medical examination without abnormal findings: Secondary | ICD-10-CM | POA: Diagnosis not present

## 2024-01-18 DIAGNOSIS — I10 Essential (primary) hypertension: Secondary | ICD-10-CM | POA: Diagnosis not present

## 2024-01-18 DIAGNOSIS — I4891 Unspecified atrial fibrillation: Secondary | ICD-10-CM | POA: Diagnosis not present

## 2024-01-18 DIAGNOSIS — J45909 Unspecified asthma, uncomplicated: Secondary | ICD-10-CM | POA: Diagnosis not present

## 2024-01-18 DIAGNOSIS — E782 Mixed hyperlipidemia: Secondary | ICD-10-CM | POA: Diagnosis not present
